# Patient Record
Sex: Male | Born: 1940 | Race: White | Hispanic: No | Marital: Married | Smoking: Never smoker
Health system: Southern US, Community
[De-identification: ages and names within clinical notes are randomized; demographics above are authoritative.]

## PROBLEM LIST (undated history)

## (undated) DIAGNOSIS — C801 Malignant (primary) neoplasm, unspecified: Secondary | ICD-10-CM

## (undated) DIAGNOSIS — K222 Esophageal obstruction: Secondary | ICD-10-CM

## (undated) DIAGNOSIS — I10 Essential (primary) hypertension: Secondary | ICD-10-CM

## (undated) DIAGNOSIS — T7840XA Allergy, unspecified, initial encounter: Secondary | ICD-10-CM

## (undated) DIAGNOSIS — E119 Type 2 diabetes mellitus without complications: Secondary | ICD-10-CM

## (undated) DIAGNOSIS — E785 Hyperlipidemia, unspecified: Secondary | ICD-10-CM

## (undated) DIAGNOSIS — K5730 Diverticulosis of large intestine without perforation or abscess without bleeding: Secondary | ICD-10-CM

## (undated) DIAGNOSIS — H269 Unspecified cataract: Secondary | ICD-10-CM

## (undated) DIAGNOSIS — K219 Gastro-esophageal reflux disease without esophagitis: Secondary | ICD-10-CM

## (undated) HISTORY — DX: Allergy, unspecified, initial encounter: T78.40XA

## (undated) HISTORY — DX: Hyperlipidemia, unspecified: E78.5

## (undated) HISTORY — DX: Type 2 diabetes mellitus without complications: E11.9

## (undated) HISTORY — DX: Essential (primary) hypertension: I10

## (undated) HISTORY — DX: Malignant (primary) neoplasm, unspecified: C80.1

## (undated) HISTORY — DX: Esophageal obstruction: K22.2

## (undated) HISTORY — DX: Unspecified cataract: H26.9

## (undated) HISTORY — DX: Gastro-esophageal reflux disease without esophagitis: K21.9

## (undated) HISTORY — DX: Diverticulosis of large intestine without perforation or abscess without bleeding: K57.30

---

## 1973-08-09 HISTORY — PX: INGUINAL HERNIA REPAIR: SUR1180

## 2005-08-09 HISTORY — PX: LAPAROSCOPIC CHOLECYSTECTOMY: SUR755

## 2005-08-26 ENCOUNTER — Ambulatory Visit: Payer: Self-pay | Admitting: Gastroenterology

## 2005-08-27 ENCOUNTER — Ambulatory Visit (HOSPITAL_COMMUNITY): Admission: RE | Admit: 2005-08-27 | Discharge: 2005-08-27 | Payer: Self-pay | Admitting: Gastroenterology

## 2005-09-17 ENCOUNTER — Ambulatory Visit: Payer: Self-pay | Admitting: Gastroenterology

## 2005-09-27 ENCOUNTER — Ambulatory Visit: Payer: Self-pay | Admitting: Gastroenterology

## 2005-09-27 DIAGNOSIS — K5730 Diverticulosis of large intestine without perforation or abscess without bleeding: Secondary | ICD-10-CM

## 2005-09-27 HISTORY — DX: Diverticulosis of large intestine without perforation or abscess without bleeding: K57.30

## 2005-10-22 ENCOUNTER — Ambulatory Visit (HOSPITAL_COMMUNITY): Admission: RE | Admit: 2005-10-22 | Discharge: 2005-10-22 | Payer: Self-pay | Admitting: Surgery

## 2005-10-22 ENCOUNTER — Encounter (INDEPENDENT_AMBULATORY_CARE_PROVIDER_SITE_OTHER): Payer: Self-pay | Admitting: *Deleted

## 2006-05-03 ENCOUNTER — Ambulatory Visit: Payer: Self-pay | Admitting: Gastroenterology

## 2008-06-03 ENCOUNTER — Ambulatory Visit: Payer: Self-pay | Admitting: Gastroenterology

## 2008-06-03 DIAGNOSIS — I10 Essential (primary) hypertension: Secondary | ICD-10-CM | POA: Insufficient documentation

## 2008-06-03 DIAGNOSIS — E119 Type 2 diabetes mellitus without complications: Secondary | ICD-10-CM | POA: Insufficient documentation

## 2008-06-03 DIAGNOSIS — K2990 Gastroduodenitis, unspecified, without bleeding: Secondary | ICD-10-CM

## 2008-06-03 DIAGNOSIS — K222 Esophageal obstruction: Secondary | ICD-10-CM | POA: Insufficient documentation

## 2008-06-03 DIAGNOSIS — K219 Gastro-esophageal reflux disease without esophagitis: Secondary | ICD-10-CM

## 2008-06-03 DIAGNOSIS — E7800 Pure hypercholesterolemia, unspecified: Secondary | ICD-10-CM

## 2008-06-03 DIAGNOSIS — K5730 Diverticulosis of large intestine without perforation or abscess without bleeding: Secondary | ICD-10-CM | POA: Insufficient documentation

## 2008-06-03 DIAGNOSIS — K2970 Gastritis, unspecified, without bleeding: Secondary | ICD-10-CM | POA: Insufficient documentation

## 2008-06-05 ENCOUNTER — Encounter: Payer: Self-pay | Admitting: Gastroenterology

## 2009-02-21 ENCOUNTER — Ambulatory Visit: Payer: Self-pay | Admitting: Gastroenterology

## 2009-02-21 DIAGNOSIS — R141 Gas pain: Secondary | ICD-10-CM

## 2009-02-21 DIAGNOSIS — R142 Eructation: Secondary | ICD-10-CM

## 2009-02-21 DIAGNOSIS — R143 Flatulence: Secondary | ICD-10-CM

## 2009-02-21 DIAGNOSIS — K902 Blind loop syndrome, not elsewhere classified: Secondary | ICD-10-CM

## 2009-02-21 LAB — CONVERTED CEMR LAB: Tissue Transglutaminase Ab, IgA: 0.5 units (ref <7)

## 2009-03-03 ENCOUNTER — Telehealth: Payer: Self-pay | Admitting: Gastroenterology

## 2009-06-10 ENCOUNTER — Encounter: Payer: Self-pay | Admitting: Gastroenterology

## 2009-08-20 ENCOUNTER — Telehealth: Payer: Self-pay | Admitting: Gastroenterology

## 2010-03-03 ENCOUNTER — Ambulatory Visit: Payer: Self-pay | Admitting: Gastroenterology

## 2010-03-23 ENCOUNTER — Ambulatory Visit (HOSPITAL_COMMUNITY): Admission: RE | Admit: 2010-03-23 | Discharge: 2010-03-23 | Payer: Self-pay | Admitting: Gastroenterology

## 2010-03-24 ENCOUNTER — Telehealth: Payer: Self-pay | Admitting: Gastroenterology

## 2010-03-27 ENCOUNTER — Telehealth: Payer: Self-pay | Admitting: Gastroenterology

## 2010-04-06 ENCOUNTER — Telehealth: Payer: Self-pay | Admitting: Gastroenterology

## 2010-04-15 ENCOUNTER — Encounter: Payer: Self-pay | Admitting: Gastroenterology

## 2010-04-16 ENCOUNTER — Encounter: Payer: Self-pay | Admitting: Gastroenterology

## 2010-08-30 ENCOUNTER — Encounter: Payer: Self-pay | Admitting: Gastroenterology

## 2010-09-08 NOTE — Progress Notes (Signed)
Summary: results request   Phone Note Call from Patient Call back at Home Phone [phone]   Caller: Patient Call For: Dr. Jarold Motto Reason for Call: Talk to Nurse Summary of Call: would like results from emptying scan Initial call taken by: Vallarie Mare,  March 24, 2010 2:57 PM  Follow-up for Phone Call        LM for pt to call.   Lupita Leash Surface RN  March 24, 2010 3:33 PM  Pt notified of results.  Pt asks if there is any thing else he can do to help with the belching.  Currently taking Aciphex. Follow-up by: Ashok Cordia RN,  March 25, 2010 8:57 AM  Additional Follow-up for Phone Call Additional follow up Details #1::        increase aciphex two times a day....schedule manometry and ph probe off  rx 72h... Additional Follow-up by: Mardella Layman MD Clementeen Graham,  March 25, 2010 9:08 AM    Additional Follow-up for Phone Call Additional follow up Details #2::    LM for pt to call.  Lupita Leash Surface RN  March 25, 2010 12:09 PM  Called phone number listed.  This is the wrong number per person answering phone.  Called work number listed.  Pt no longer works at this number.  He has retired per person answering phone.  Letter mailed asking pt to call. Follow-up by: Ashok Cordia RN,  March 26, 2010 8:51 AM   Appended Document: results request Pt notified.  Pt would like to try two times a day aciphex before he schedules any further tests.  Pt will reoprt back.  (phone numbers verified)   Clinical Lists Changes  Medications: Changed medication from ACIPHEX 20 MG  TBEC (RABEPRAZOLE SODIUM) Take 1 each day 30 minutes before meals to ACIPHEX 20 MG  TBEC (RABEPRAZOLE SODIUM) 1 tab by mouth two times a day - Signed Rx of ACIPHEX 20 MG  TBEC (RABEPRAZOLE SODIUM) 1 tab by mouth two times a day;  #60 x 6;  Signed;  Entered by: Ashok Cordia RN;  Authorized by: Mardella Layman MD [facility];  Method used: Electronically to [facility] [id number] [place],  [place], [place]  [address], Ph: [phone] or [phone], Fax: [phone]    Prescriptions: ACIPHEX 20 MG  TBEC (RABEPRAZOLE SODIUM) 1 tab by mouth two times a day  #60 x 6   Entered by:   Ashok Cordia RN   Authorized by:   Mardella Layman MD [facility]   Signed by:   Ashok Cordia RN on 03/30/2010   Method used:   Electronically to        [facility]  [address]       [address]       [place], [place]  [address]       Ph: [phone] or [phone]       Fax: [phone]   RxID:   [id number]

## 2010-09-08 NOTE — Progress Notes (Signed)
Summary: discount card   Phone Note Call from Patient Call back at Home Phone [phone]   Caller: Patient Call For: Jarold Motto Reason for Call: Talk to Nurse Summary of Call: Patient would like discount card for Aciphex mailed over to his house Initial call taken by: Tawni Levy,  August 20, 2009 10:13 AM  Follow-up for Phone Call        Discount card mailed to pt.  Wife notified.  Follow-up by: Ashok Cordia RN,  August 20, 2009 1:48 PM

## 2010-09-08 NOTE — Assessment & Plan Note (Signed)
Summary: f/u//belching--ch.   History of Present Illness Visit Type: Follow-up Visit Primary GI MD: Sheryn Bison MD FACP FAGA Primary Provider: Roney Marion, MD Requesting Provider: n/a Chief Complaint: follow up GERD, pt still having some belching on AcipHex History of Present Illness:   postprandial burping and belching and mild early satiety and a 70 year old white male with insulin-dependent diabetes. He has had previous endoscopy which confirmed gastroesophageal reflux disease. Is on AcipHex 20 mg a day, insulin, metformin, and Actos. He denies hepatobiliary or lower gastrointestinal problems. He is status post cholecystectomy.   GI Review of Systems    Reports belching.      Denies abdominal pain, acid reflux, bloating, chest pain, dysphagia with liquids, dysphagia with solids, heartburn, loss of appetite, nausea, vomiting, vomiting blood, weight loss, and  weight gain.        Denies anal fissure, black tarry stools, change in bowel habit, constipation, diarrhea, diverticulosis, fecal incontinence, heme positive stool, hemorrhoids, irritable bowel syndrome, jaundice, light color stool, liver problems, rectal bleeding, and  rectal pain.    Current Medications (verified): 1)  Lantus 100 Unit/ml Soln (Insulin Glargine) .... Inject 45 Units At Bedtime 2)  Vytorin 10-20 Mg Tabs (Ezetimibe-Simvastatin) .... Take One By Mouth Once Daily 3)  Actos 45 Mg Tabs (Pioglitazone Hcl) .Marland Kitchen.. 1 Tablet By Mouth Once Daily 4)  Metformin Hcl 1000 Mg Tabs (Metformin Hcl) .... Take One By Mouth Two Times A Day 5)  Humalog Kwikpen 100 Unit/ml Soln (Insulin Lispro (Human)) .... Take As Directed 6)  Aciphex 20 Mg  Tbec (Rabeprazole Sodium) .... Take 1 Each Day 30 Minutes Before Meals 7)  Cozaar 100 Mg Tabs (Losartan Potassium) .... One Tablet By Mouth Once Daily 8)  Maalox 600 Mg Chew (Calcium Carbonate Antacid) .... As Needed For Heartburn  Allergies (verified): No Known Drug Allergies  Past  History:  Past medical, surgical, family and social histories (including risk factors) reviewed for relevance to current acute and chronic problems.  Past Medical History: Reviewed history from 02/21/2009 and no changes required. Current Problems:  ABDOMINAL BLOATING (ICD-787.3) HYPERCHOLESTEROLEMIA (ICD-272.0) HYPERTENSION (ICD-401.9) DIABETES MELLITUS, TYPE II, ON INSULIN (ICD-250.00) GERD (ICD-530.81) DIVERTICULOSIS, COLON (ICD-562.10) ESOPHAGEAL STRICTURE (ICD-530.3) GASTRITIS (ICD-535.50)  Past Surgical History: Reviewed history from 06/03/2008 and no changes required. Cholecystectomy  Family History: Reviewed history from 06/03/2008 and no changes required. No FH of Colon Cancer: Family History of Diabetes: grandparents  Social History: Reviewed history from 02/21/2009 and no changes required. Married, 2 girls Patient has never smoked.  Alcohol Use - no Illicit Drug Use - no Occupation: Retired [employer] Daily Caffeine Use: 3 cups of coffee daily and 2 sodas daily  Patient does not get regular exercise.   Review of Systems  The patient denies allergy/sinus, anemia, anxiety-new, arthritis/joint pain, back pain, blood in urine, breast changes/lumps, confusion, cough, coughing up blood, depression-new, fainting, fatigue, fever, headaches-new, hearing problems, heart murmur, heart rhythm changes, itching, muscle pains/cramps, night sweats, nosebleeds, shortness of breath, skin rash, sleeping problems, sore throat, swelling of feet/legs, swollen lymph glands, thirst - excessive, urination - excessive, urination changes/pain, urine leakage, vision changes, and voice change.    Vital Signs:  Patient profile:   70 year old male Height:      68 inches Weight:      195 pounds BMI:     29.76 Pulse rate:   68 / minute Pulse rhythm:   regular BP sitting:   122 / 66  (left arm) Cuff size:  regular  Vitals Entered By: Francee Piccolo CMA Duncan Dull) (March 03, 2010 1:42 PM)  Physical Exam  General:  Well developed, well nourished, no acute distress.healthy appearing.   Head:  Normocephalic and atraumatic. Eyes:  PERRLA, no icterus.exam deferred to patient's ophthalmologist.   Lungs:  Clear throughout to auscultation. Heart:  Regular rate and rhythm; no murmurs, rubs,  or bruits. Abdomen:  Soft, nontender and nondistended. No masses, hepatosplenomegaly or hernias noted. Normal bowel sounds.I cannot appreciate a succussion splash. Extremities:  No clubbing, cyanosis, edema or deformities noted. Neurologic:  Alert and  oriented x4;  grossly normal neurologically. Psych:  Alert and cooperative. Normal mood and affect.   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 We will check gastric emptying scan and initiate domperidone therapy if needed. He is to continue his antireflux maneuvers and daily PPI therapy pending these results. 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  Patient Instructions: 1)  You are scheduled for a gastric emptying scan. 2)  Please continue current medications.  3)  Liquids and foods should be eaten in small, frequent meals. Refer to brochure for further instruction.  4)  The medication list was reviewed and reconciled.  All changed / newly prescribed medications were explained.  A complete medication list was provided to the patient / caregiver. 5)  Copy sent to : Dr. Roney Marion. 6)  Please continue current medications.  Prescriptions: ACIPHEX 20 MG  TBEC (RABEPRAZOLE SODIUM) Take 1 each day 30 minutes before meals  #30 x 6   Entered by:   Ashok Cordia RN   Authorized by:   Mardella Layman MD [facility]   Signed by:   Ashok Cordia RN on 03/03/2010   Method used:   Print then Give to Patient    RxID:   [id number]

## 2010-09-08 NOTE — Medication Information (Signed)
Summary: Prior Aut[employer] & Approved for Aciphex/[employer]  Prior [employer] Approved for Aciphex/[employer]   Imported By: Sherian Rein 04/21/2010 09:36:11  _____________________________________________________________________  External Attachment:    Type:   Image     Comment:   External Document

## 2010-09-08 NOTE — Miscellaneous (Signed)
Summary: Walkin  Patient walked in to check on his prior auth and advised him i called [employer] and advised pt that I will call him when we hear back from [facility]. I gave him samples again.   Clinical Lists Changes  Medications: Changed medication from ACIPHEX 20 MG  TBEC (RABEPRAZOLE SODIUM) 1 tab by mouth two times a day to ACIPHEX 20 MG  TBEC (RABEPRAZOLE SODIUM) 1 tab by mouth two times a day  Appended Document: Walkin approved per fax valid from 03/26/2010-04/16/2011

## 2010-09-08 NOTE — Progress Notes (Signed)
Summary: Aciphex Prio Auth   Phone Note Outgoing Call   Call placed by: Ok Anis CMA,  March 27, 2010 9:24 AM Call placed to: Insurer Summary of Call: Prior Authorization done for Aciphex 20mg . Per Darl Pikes at [place] [phone] there is already a Prior Authorization that expires 06-10-2010 so patients Aciphex is covered until then. Darl Pikes said that she sent the information to patients pharmacy so they can refill the RX

## 2010-09-08 NOTE — Progress Notes (Signed)
Summary: speak to nurse re: Aciphex  Medications Added ACIPHEX 20 MG  TBEC (RABEPRAZOLE SODIUM) 1 tab by mouth two times a day       Phone Note Call from Patient Call back at [facility] Phone [phone]   Caller: Patient Call For: Dr Jarold Motto Reason for Call: Talk to Nurse Summary of Call: Patient wants nurse to call pharmacy regarding taking aciphex twice a day. Patient thinks that it was auth for once a day not twice a day. Initial call taken by: Tawni Levy,  April 06, 2010 4:18 PM  Follow-up for Phone Call        Pt states that pharmacist is telling him that Aciphex is not going through on insurance.  It may because it is prescribed two times a day and authorixation is for one per day.  We will try to get samples of aciphex for pt to last until we can get approval. Follow-up by: Ashok Cordia RN,  April 07, 2010 8:39 AM  Additional Follow-up for Phone Call Additional follow up Details #1::        Samples given.  Pt notified. Additional Follow-up by: Ashok Cordia RN,  April 07, 2010 1:51 PM    New/Updated Medications: ACIPHEX 20 MG  TBEC (RABEPRAZOLE SODIUM) 1 tab by mouth two times a day

## 2010-11-25 ENCOUNTER — Other Ambulatory Visit: Payer: Self-pay | Admitting: Gastroenterology

## 2010-12-25 NOTE — Op Note (Signed)
NAMELAURICE, KIMMONS                  ACCOUNT NO.:  [id number]   MEDICAL RECORD NO.:  [id number]          PATIENT TYPE:  AMB   LOCATION:  DAY                          FACILITY[facility]  [facility]   PHYSICIAN:  Currie Paris, M.D.DATE OF BIRTH:  Dec 13, 1940   DATE OF PROCEDURE:  10/22/2005  DATE OF DISCHARGE:                                 OPERATIVE REPORT   CCS:  [phone].   PREOPERATIVE DIAGNOSIS:  Chronic calculus cholecystitis.   POSTOPERATIVE DIAGNOSIS:  Chronic calculus cholecystitis.   OPERATION:  Laparoscopic cholecystectomy with operative cholangiogram.   SURGEON:  Dr. Jamey Ripa.   ASSISTANT:  Dr. Orson Slick.   ANESTHESIA:  General.   CLINICAL HISTORY:  This is a 70 year old male with insulin dependent  diabetes and gallstones who was having symptoms suggestive of biliary tract  disease. We elected to proceed to cholecystectomy.   DESCRIPTION OF PROCEDURE:  The patient was seen in the holding area and he  had no further questions. He was taken to the operating room and after  satisfactory general endotracheal anesthesia had been obtained, the abdomen  was clipped, prepped and draped. The time-out occurred.   0.25% plain Marcaine was used for each incision. The umbilical incision was  made, the fascia opened and the peritoneal cavity entered under direct  vision. The pursestring was placed, the Hasson introduced and the abdomen  insufflated to 15.   A camera was placed and there were no gross abnormalities noted. The patient  was placed in reverse Trendelenburg and tilted to the left. Under direct  vision, a 10/11 trocar was placed in the epigastrium and two 5 mm trocars  were placed laterally.   The gallbladder was a little bit contracted but otherwise unremarkable. The  liver appeared normal.   The gallbladder was retracted over the liver. The cystic duct was opened and  the triangle of Calot dissected. I clearly identified a lengthy segment of  cystic duct as well as the  cystic artery. I opened up the peritoneum on  either side of the gallbladder to be sure we had a nice window and that the  anatomy was confirmed.   At this point, a clip was placed in the cystic duct at its junction with  the gallbladder and one on the cystic artery. The cystic duct was opened and  a Cook catheter introduced percutaneously and threaded into the cystic duct  and held in place with a clip.   The operative cholangiogram was then done. This appeared normal with good  filling of the common duct, hepatic radicals and duodenum with no filling  defects noted.   The cystic duct catheter was removed and two clips placed on the stay side  of the cystic duct. Two additional clips were placed in the cystic artery.  Both were divided leaving two clips on the stay side.   The gallbladder was removed from below to above with coagulation current of  the cautery. Just prior to disconnecting it, we irrigated and cauterized the  bed and made sure everything was dry. The gallbladder was then disconnected  and brought  out the umbilical port.   The abdomen was reinsufflated and the final check made for hemostasis and  again everything appeared okay. The lateral ports were removed and there was  no bleeding. The abdomen was deflated through the epigastric port. The  pursestring was used to close the umbilicus and a Vicryl was used to close  the fascia at the epigastric site. The skin was closed with 4-0 Monocryl  subcuticular and Dermabond.   The patient tolerated the procedure well. There were no operative  complications. All counts were correct.      Currie Paris, M.D.  Electronically Signed     CJS/MEDQ  D:  10/22/2005  T:  10/23/2005  Job:  [id number]   cc:   Gayland Curry, MD   Vania Rea. Jarold Motto, M.D. [facility]  [address]  [place]  [place] [address]

## 2010-12-25 NOTE — Assessment & Plan Note (Signed)
[facility]                           GASTROENTEROLOGY OFFICE NOTE   NAME:Exton, KALYN DIMATTIA                         MRN:          [id number]  DATE:05/03/2006                            DOB:          09-12-1940    PROBLEM LIST:  1. Gastroesophageal reflux disease with history of esophageal stricture.  2. Status post laparoscopic cholecystectomy in March of 2007.  3. Insulin dependent diabetes mellitus.   HISTORY:  Brailon comes in today for follow-up.  He has been on Nexium 40 mg  per day over the past several months after undergoing endoscopy in February  of 2007 which did show evidence of chronic gastroesophageal reflux disease  and a stricture in the distal esophagus which was Elease Hashimoto dilated to #58.  He says that even with the Nexium, he continued to have a lot of belching  and burping and some indigestion during the day time hours.  He has not had  any night time symptoms, has no current complaints of dysphagia or  odynophagia.  Denies any early satiety symptoms or significant bloating.  He  is not aware of any sour brash, has not been regurgitating.  He says he ran  out of his Nexium a couple of weeks ago and actually tried some of his  wife's Aciphex which he says seems to work much better and he would like a  prescription for Aciphex.  He also says that he feels that the Nexium was  causing him to have some diarrhea.   CURRENT MEDICATIONS:  1. Lantus 30 units at bedtime.  2. Humalog p.r.n.  3. Altace 10 mg daily.  4. Benicar 40 daily.  5. Vytorin 10/20 daily.  6. Metformin 1000 mg twice daily.  7. Actos 20 daily.   ALLERGIES:  NO KNOWN DRUG ALLERGIES.   EXAM:  Discussion only today.  Weight is 183, blood pressure 126/74, pulse  60, not further examined.   IMPRESSION:  1. Chronic gastroesophageal reflux disease with history of distal      esophageal stricture, currently asymptomatic.  Gastroesophageal reflux      disease symptoms poorly  controlled with Nexium.  Question component of      diabetic visceral neuropathy.   PLAN:  1. I will switch him to Aciphex 20 mg by mouth daily every morning, a.c.      breakfast.  He was      given samples and a prescription today.  He is asked to follow-up in      four to six weeks if the Aciphex does not well control his symptoms.                                   Amy Esterwood, PA-C                                Vania Rea. Jarold Motto, MD, Clementeen Graham, FACP   AE/MedQ  DD:  05/03/2006  DT:  05/05/2006  Job #:  [id number] 

## 2011-05-04 ENCOUNTER — Telehealth: Payer: Self-pay | Admitting: Gastroenterology

## 2011-05-04 NOTE — Telephone Encounter (Signed)
I have already done prior auth I am waiting on his insurance to let me know if its approved.Marland Kitchen

## 2011-05-07 ENCOUNTER — Telehealth: Payer: Self-pay | Admitting: Gastroenterology

## 2011-05-07 NOTE — Telephone Encounter (Signed)
Done prior auth on the phone with [employer] and drug and qty is approved. pts wife aware.

## 2011-05-07 NOTE — Telephone Encounter (Signed)
I will call about the prior auth today.

## 2011-07-06 ENCOUNTER — Other Ambulatory Visit: Payer: Self-pay | Admitting: Gastroenterology

## 2011-09-09 ENCOUNTER — Telehealth: Payer: Self-pay | Admitting: Gastroenterology

## 2011-09-09 MED ORDER — LANSOPRAZOLE 30 MG PO CPDR: 30.0000 mg | DELAYED_RELEASE_CAPSULE | Freq: Every day | ORAL | Status: DC

## 2011-09-09 NOTE — Telephone Encounter (Signed)
Per pt his insurance will not cover aciphex any longer but will cover prevacid he would like to have it sent to his local pharmacy to try for 30 days, I have sent it for once a day and if that doesn't work well will send it for twice a day dosing. He will call back in one month after trying it.

## 2011-11-02 ENCOUNTER — Telehealth: Payer: Self-pay | Admitting: Gastroenterology

## 2011-11-02 MED ORDER — LANSOPRAZOLE 30 MG PO CPDR: 30.0000 mg | DELAYED_RELEASE_CAPSULE | Freq: Two times a day (BID) | ORAL | Status: DC

## 2011-11-02 NOTE — Telephone Encounter (Deleted)
Patient states that the change in his PPI is working  But he feels he needs BID dosing of the Prevacid, his last office visit was 03/03/2010. Is it ok to just increase to BID dosing or does patient need an office visit as well?

## 2011-11-02 NOTE — Telephone Encounter (Signed)
rx changed to BID dosing and pt will need ov in July. Pt advised

## 2012-01-10 ENCOUNTER — Other Ambulatory Visit: Payer: Self-pay | Admitting: Gastroenterology

## 2012-02-02 ENCOUNTER — Encounter: Payer: Self-pay | Admitting: *Deleted

## 2012-02-15 ENCOUNTER — Other Ambulatory Visit (INDEPENDENT_AMBULATORY_CARE_PROVIDER_SITE_OTHER): Payer: Medicare Other

## 2012-02-15 ENCOUNTER — Encounter: Payer: Self-pay | Admitting: Gastroenterology

## 2012-02-15 ENCOUNTER — Ambulatory Visit (INDEPENDENT_AMBULATORY_CARE_PROVIDER_SITE_OTHER): Payer: Medicare Other | Admitting: Gastroenterology

## 2012-02-15 VITALS — BP 124/64 | HR 60 | Ht 68.0 in | Wt 194.0 lb

## 2012-02-15 DIAGNOSIS — K219 Gastro-esophageal reflux disease without esophagitis: Secondary | ICD-10-CM

## 2012-02-15 DIAGNOSIS — Z8639 Personal history of other endocrine, nutritional and metabolic disease: Secondary | ICD-10-CM

## 2012-02-15 DIAGNOSIS — R6889 Other general symptoms and signs: Secondary | ICD-10-CM

## 2012-02-15 DIAGNOSIS — R52 Pain, unspecified: Secondary | ICD-10-CM

## 2012-02-15 DIAGNOSIS — E119 Type 2 diabetes mellitus without complications: Secondary | ICD-10-CM

## 2012-02-15 DIAGNOSIS — Z862 Personal history of diseases of the blood and blood-forming organs and certain disorders involving the immune mechanism: Secondary | ICD-10-CM

## 2012-02-15 LAB — IBC PANEL
Iron: 83 ug/dL (ref 42–165)
Transferrin: 256.6 mg/dL (ref 212.0–360.0)

## 2012-02-15 LAB — FOLATE: Folate: 17.2 ng/mL (ref >5.9)

## 2012-02-15 LAB — VITAMIN B12: Vitamin B-12: 345 pg/mL (ref 211–911)

## 2012-02-15 MED ORDER — ALIGN 4 MG PO CAPS: 1.0000 | ORAL_CAPSULE | Freq: Every day | ORAL | Status: DC

## 2012-02-15 MED ORDER — LANSOPRAZOLE 30 MG PO CPDR: DELAYED_RELEASE_CAPSULE | ORAL | Status: DC

## 2012-02-15 NOTE — Progress Notes (Signed)
This is a 71 year old Caucasian male with insulin-dependent diabetes, follow for several years with chronic GERD. He currently is on Prevacid 30 mg twice a day and is asymptomatic without acid reflux symptoms or dysphagia, lower gastrointestinal or hepatobiliary complaints. His appetite is good and his weight is stable. He does take a daily stool softener. The patient is up-to-date on his endoscopy and colonoscopy. He has yearly blood work performed last primary care physicians.   Current Medications, Allergies, Past Medical History, Past Surgical History, Family History and Social History were reviewed in [employer] record.  Pertinent Review of Systems Negative   Physical Exam: Healthy patient in no distress. Blood pressure 124 was 64, pulse 60 and regular, and weight under 94 pounds with a BMI of 29.50. I cannot appreciate stigmata of chronic liver disease. Chest is clear and he is in a regular rhythm without murmurs gallops or rubs. There is no organomegaly, abdominal masses or tenderness. Bowel sounds are normal. Peripheral extremities are unremarkable and mental status is normal.    Assessment and Plan: Chronic GERD doing well on daily PPI therapy. I will check his serum B12 level, have renewed his prescriptions, and we'll see him on a yearly basis unless otherwise needed. He is up-to-date on his endoscopic exams, and his diabetes is followed closely by his primary care physician. Standard antireflux regime reviewed with patient. Encounter Diagnoses  Name Primary?  . Esophageal reflux Yes  . General symptoms(aka GENERAL SYMPTOMS)    . A vague feeling of discomfort    . Other general symptoms    . Diabetes mellitus   . H/O diabetes mellitus

## 2012-02-15 NOTE — Patient Instructions (Addendum)
Your physician has requested that you go to the basement for the following lab work before leaving today:  Your prescription for Prevacid has been sent to your mail order pharmacy. We have given you samples of Align. This puts good bacteria back into your colon. You should take 1 capsule by mouth once daily. If this works well for you, it can be purchased over the counter.

## 2012-02-17 LAB — FERRITIN: Ferritin: 53.3 ng/mL (ref 22.0–322.0)

## 2012-02-22 ENCOUNTER — Telehealth: Payer: Self-pay | Admitting: Gastroenterology

## 2012-02-22 NOTE — Telephone Encounter (Signed)
Dr Jarold Motto, can you review labs from 02/15/12 and advise if anything? Thanks.

## 2012-02-22 NOTE — Telephone Encounter (Signed)
Pt aware.

## 2012-02-22 NOTE — Telephone Encounter (Signed)
lmom for pt to call back

## 2012-02-22 NOTE — Telephone Encounter (Signed)
All normal.

## 2012-09-14 ENCOUNTER — Other Ambulatory Visit: Payer: Self-pay | Admitting: *Deleted

## 2012-09-14 MED ORDER — LANSOPRAZOLE 30 MG PO CPDR: DELAYED_RELEASE_CAPSULE | ORAL | Status: DC

## 2012-10-18 ENCOUNTER — Telehealth: Payer: Self-pay | Admitting: Gastroenterology

## 2012-10-18 MED ORDER — LANSOPRAZOLE 30 MG PO CPDR: DELAYED_RELEASE_CAPSULE | ORAL | Status: DC

## 2012-10-18 NOTE — Telephone Encounter (Signed)
Rx sent 

## 2012-11-06 ENCOUNTER — Telehealth: Payer: Self-pay | Admitting: *Deleted

## 2012-11-06 NOTE — Telephone Encounter (Signed)
Received via fax prior auth for patients Lansoprazole.  I was given four numbers to call for prior auth with no success when called. [phone]  Called pharmacy back([facility]) and they could not offer any other numbers to call expect one that was attempted earlier.   I have forms for request for Medicare Prescription Drug Coverage Determination. Form was filled out and faxed.  Pharmacy notified that I am waiting on a response back from faxed information.  Left message on patients voicemail for return call back.

## 2012-11-07 ENCOUNTER — Telehealth: Payer: Self-pay | Admitting: Gastroenterology

## 2012-11-07 NOTE — Telephone Encounter (Signed)
I called Alissa at [facility] back and gave information on last EGD.  Per Alissa we should know by end day or tomorrow about patient's prior authorization    Left message for patient to call office back. Needed to let patient to know about prior authorization status and if they have enough medication to get through.

## 2012-11-08 ENCOUNTER — Telehealth: Payer: Self-pay | Admitting: *Deleted

## 2012-11-08 MED ORDER — ESOMEPRAZOLE MAGNESIUM 40 MG PO CPDR: 40.0000 mg | DELAYED_RELEASE_CAPSULE | Freq: Every day | ORAL | Status: DC

## 2012-11-08 NOTE — Telephone Encounter (Signed)
Patient called back and I advised patient that Lansoprazole was not covered by [facility].   Advised patient I can send Nexium and he can try it at one daily and if that does not work we can go for two a day.  Patient verbalized understanding.

## 2012-11-08 NOTE — Telephone Encounter (Signed)
Lansoprazole was denied by [facility].  Called patient to inform him of this and to see what else he has tried.  Left message for patient to call office back.  I can try sending Nexium, Omeprazole or Protonix.

## 2012-12-05 ENCOUNTER — Telehealth: Payer: Self-pay | Admitting: Gastroenterology

## 2012-12-05 MED ORDER — OMEPRAZOLE 40 MG PO CPDR: 40.0000 mg | DELAYED_RELEASE_CAPSULE | Freq: Every day | ORAL | Status: DC

## 2012-12-05 NOTE — Telephone Encounter (Signed)
Sent Omeprazole.

## 2013-12-03 ENCOUNTER — Other Ambulatory Visit: Payer: Self-pay | Admitting: Gastroenterology

## 2013-12-04 ENCOUNTER — Ambulatory Visit (INDEPENDENT_AMBULATORY_CARE_PROVIDER_SITE_OTHER): Payer: Medicare PPO | Admitting: Gastroenterology

## 2013-12-04 ENCOUNTER — Encounter: Payer: Self-pay | Admitting: Gastroenterology

## 2013-12-04 VITALS — BP 128/70 | HR 64 | Ht 67.0 in | Wt 205.2 lb

## 2013-12-04 DIAGNOSIS — K219 Gastro-esophageal reflux disease without esophagitis: Secondary | ICD-10-CM

## 2013-12-04 MED ORDER — OMEPRAZOLE 40 MG PO CPDR: 40.0000 mg | DELAYED_RELEASE_CAPSULE | Freq: Every day | ORAL | Status: DC

## 2013-12-04 NOTE — Progress Notes (Addendum)
12/04/2013 Cody Mccann [id number] 1940-09-16   History of Present Illness:  This is a pleasant 73 year old male who was previously known to Dr. Sharlett Iles.  He follows here for chronic GERD and is usually just seen once a year to get his medication refilled.  His symptoms are well controlled on omeprazole 40 mg daily.  No complaints.  Had colonoscopy in 09/2005 with only diverticulosis seen.  Will be due for recall in 09/2015.  Would like to have Dr. Hilarie Fredrickson as his new GI MD.   Current Medications, Allergies, Past Medical History, Past Surgical History, Family History and Social History were reviewed in [place] record.   Physical Exam: BP 128/70  Pulse 64  Ht 5\' 7"  (1.702 m)  Wt 205 lb 4 oz (93.101 kg)  BMI 32.14 kg/m2 General: Well developed white male in no acute distress Head: Normocephalic and atraumatic Eyes:  Sclerae anicteric, conjunctiva pink  Ears: Normal auditory acuity Lungs: Clear throughout to auscultation Heart: Regular rate and rhythm Abdomen: Soft, non-distended.  Normal bowel sounds.  Non-tender. Musculoskeletal: Symmetrical with no gross deformities  Extremities: No edema  Neurological: Alert oriented x 4, grossly non-focal Psychological:  Alert and cooperative. Normal mood and affect  Assessment and Recommendations: -GERD:  Well-controlled on omeprazole 40 mg daily.  Will continue and we will refill that for him.  He would like Dr. Hilarie Fredrickson to be his new GI MD.  Addendum: Reviewed and agree with management. Jerene Bears, MD

## 2013-12-04 NOTE — Patient Instructions (Signed)
We have sent the following medications to your pharmacy for you to pick up at your convenience: Omeprazole 40 mg  You will be due for a recall colonoscopy in February 2017. We will send you a reminder in the mail when it gets closer to that time.

## 2014-11-25 DIAGNOSIS — Z961 Presence of intraocular lens: Secondary | ICD-10-CM | POA: Diagnosis not present

## 2014-11-25 DIAGNOSIS — E119 Type 2 diabetes mellitus without complications: Secondary | ICD-10-CM | POA: Diagnosis not present

## 2014-11-25 DIAGNOSIS — H43813 Vitreous degeneration, bilateral: Secondary | ICD-10-CM | POA: Diagnosis not present

## 2014-11-25 DIAGNOSIS — Z9841 Cataract extraction status, right eye: Secondary | ICD-10-CM | POA: Diagnosis not present

## 2014-11-25 DIAGNOSIS — Z9842 Cataract extraction status, left eye: Secondary | ICD-10-CM | POA: Diagnosis not present

## 2014-11-25 DIAGNOSIS — H52222 Regular astigmatism, left eye: Secondary | ICD-10-CM | POA: Diagnosis not present

## 2014-11-25 DIAGNOSIS — H43313 Vitreous membranes and strands, bilateral: Secondary | ICD-10-CM | POA: Diagnosis not present

## 2014-11-25 DIAGNOSIS — I10 Essential (primary) hypertension: Secondary | ICD-10-CM | POA: Diagnosis not present

## 2014-11-25 DIAGNOSIS — H5202 Hypermetropia, left eye: Secondary | ICD-10-CM | POA: Diagnosis not present

## 2015-03-06 DIAGNOSIS — E780 Pure hypercholesterolemia: Secondary | ICD-10-CM | POA: Diagnosis not present

## 2015-03-06 DIAGNOSIS — Z0000 Encounter for general adult medical examination without abnormal findings: Secondary | ICD-10-CM | POA: Diagnosis not present

## 2015-03-06 DIAGNOSIS — I10 Essential (primary) hypertension: Secondary | ICD-10-CM | POA: Diagnosis not present

## 2015-03-06 DIAGNOSIS — E1169 Type 2 diabetes mellitus with other specified complication: Secondary | ICD-10-CM | POA: Diagnosis not present

## 2015-03-06 DIAGNOSIS — Z79899 Other long term (current) drug therapy: Secondary | ICD-10-CM | POA: Diagnosis not present

## 2015-05-06 DIAGNOSIS — E785 Hyperlipidemia, unspecified: Secondary | ICD-10-CM | POA: Diagnosis not present

## 2015-05-06 DIAGNOSIS — Z6831 Body mass index (BMI) 31.0-31.9, adult: Secondary | ICD-10-CM | POA: Diagnosis not present

## 2015-05-06 DIAGNOSIS — E1165 Type 2 diabetes mellitus with hyperglycemia: Secondary | ICD-10-CM | POA: Diagnosis not present

## 2015-05-06 DIAGNOSIS — E663 Overweight: Secondary | ICD-10-CM | POA: Diagnosis not present

## 2015-05-06 DIAGNOSIS — Z794 Long term (current) use of insulin: Secondary | ICD-10-CM | POA: Diagnosis not present

## 2015-05-06 DIAGNOSIS — I10 Essential (primary) hypertension: Secondary | ICD-10-CM | POA: Diagnosis not present

## 2015-09-02 ENCOUNTER — Encounter: Payer: Self-pay | Admitting: Internal Medicine

## 2015-10-02 ENCOUNTER — Ambulatory Visit (AMBULATORY_SURGERY_CENTER): Payer: Self-pay | Admitting: *Deleted

## 2015-10-02 VITALS — Ht 68.0 in | Wt 206.2 lb

## 2015-10-02 DIAGNOSIS — Z1211 Encounter for screening for malignant neoplasm of colon: Secondary | ICD-10-CM

## 2015-10-02 MED ORDER — NA SULFATE-K SULFATE-MG SULF 17.5-3.13-1.6 GM/177ML PO SOLN: ORAL | Status: DC

## 2015-10-02 NOTE — Progress Notes (Signed)
No allergies to eggs or soy. No problems with anesthesia.  Pt given Emmi instructions for colonoscopy  No oxygen use  No diet drug use  

## 2015-10-16 ENCOUNTER — Encounter: Payer: Self-pay | Admitting: Internal Medicine

## 2015-10-16 ENCOUNTER — Ambulatory Visit (AMBULATORY_SURGERY_CENTER): Payer: Medicare Other | Admitting: Internal Medicine

## 2015-10-16 VITALS — BP 107/59 | HR 52 | Temp 99.1°F | Resp 17 | Ht 68.0 in | Wt 206.0 lb

## 2015-10-16 DIAGNOSIS — Z1211 Encounter for screening for malignant neoplasm of colon: Secondary | ICD-10-CM | POA: Diagnosis not present

## 2015-10-16 DIAGNOSIS — D122 Benign neoplasm of ascending colon: Secondary | ICD-10-CM | POA: Diagnosis not present

## 2015-10-16 DIAGNOSIS — D123 Benign neoplasm of transverse colon: Secondary | ICD-10-CM | POA: Diagnosis not present

## 2015-10-16 DIAGNOSIS — D120 Benign neoplasm of cecum: Secondary | ICD-10-CM | POA: Diagnosis not present

## 2015-10-16 MED ORDER — SODIUM CHLORIDE 0.9 % IV SOLN: 500.0000 mL | INTRAVENOUS | Status: DC

## 2015-10-16 NOTE — Progress Notes (Signed)
A/ox3, pleased with MAC, report to RN 

## 2015-10-16 NOTE — Progress Notes (Signed)
Called to room to assist during endoscopic procedure.  Patient ID and intended procedure confirmed with present staff. Received instructions for my participation in the procedure from the performing physician.  

## 2015-10-16 NOTE — Op Note (Signed)
[place] Patient Name: Cody Mccann Procedure Date: 10/16/2015 7:27 AM MRN: [id number] Endoscopist: Jerene Bears , MD Age: 75 Referring MD:  Date of Birth: 1941/05/02 Gender: Male Procedure:                Colonoscopy Indications:              Screening for colorectal malignant neoplasm, Last                            colonoscopy 10 years ago Medicines:                Monitored Anesthesia Care Procedure:                Pre-Anesthesia Assessment:                           - Prior to the procedure, a History and Physical                            was performed, and patient medications and                            allergies were reviewed. The patient's tolerance of                            previous anesthesia was also reviewed. The risks                            and benefits of the procedure and the sedation                            options and risks were discussed with the patient.                            All questions were answered, and informed consent                            was obtained. Prior Anticoagulants: The patient has                            taken no previous anticoagulant or antiplatelet                            agents. ASA Grade Assessment: III - A patient with                            severe systemic disease. After reviewing the risks                            and benefits, the patient was deemed in                            satisfactory condition to undergo the procedure.  After obtaining informed consent, the colonoscope                            was passed under direct vision. Throughout the                            procedure, the patient's blood pressure, pulse, and                            oxygen saturations were monitored continuously. The                            Model CF-HQ190L [id number]) scope was introduced                            through the anus and advanced to the the cecum,                 identified by appendiceal orifice and ileocecal                            valve. The colonoscopy was performed without                            difficulty. The patient tolerated the procedure                            well. The quality of the bowel preparation was                            good. The ileocecal valve, appendiceal orifice, and                            rectum were photographed. Scope In: 8:08:34 AM Scope Out: 8:32:02 AM Scope Withdrawal Time: 0 hours 18 minutes 31 seconds  Total Procedure Duration: 0 hours 23 minutes 28 seconds  Findings:      A 20 mm polyp was found in the proximal transverse colon. The polyp was       sessile. The polyp was removed with a hot snare. Resection and retrieval       were complete.      Five sessile polyps were found in the transverse colon, ascending colon       and cecum. The polyps were 3 to 6 mm in size. These polyps were removed       with a cold snare. Resection and retrieval were complete.      Multiple diverticula were found in the left colon.      Internal hemorrhoids were found during retroflexion. The hemorrhoids       were small. Complications:            No immediate complications. Estimated Blood Loss:     Estimated blood loss was minimal. Impression:               - One 20 mm polyp in the proximal transverse colon,  removed with a hot snare. Resected and retrieved.                           - Five 3 to 6 mm polyps in the transverse colon, in                            the ascending colon and in the cecum, removed with                            a cold snare. Resected and retrieved.                           - Diverticulosis in the left colon.                           - Internal hemorrhoids. Recommendation:           - Patient has a contact number available for                            emergencies. The signs and symptoms of potential                            delayed  complications were discussed with the                            patient. Return to normal activities tomorrow.                            Written discharge instructions were provided to the                            patient.                           - Resume previous diet.                           - Continue present medications.                           - Await pathology results.                           - Repeat colonoscopy is recommended for                            surveillance. The colonoscopy date will be                            determined after pathology results from today's                            exam become available for review.                           -  No ibuprofen, naproxen, or other non-steroidal                            anti-inflammatory drugs for 2 weeks after polyp                            removal. Procedure Code(s):        --- Professional ---                           [phone], Colonoscopy, flexible; with removal of                            tumor(s), polyp(s), or other lesion(s) by snare                            technique CPT copyright 2016 [employer]. All rights reserved. Lajuan [employer]. Hilarie Fredrickson, MD Jerene Bears, MD 10/16/2015 8:39:05 AM This report has been signed electronically. Number of Addenda: 0

## 2015-10-16 NOTE — Patient Instructions (Signed)
Avoid NSAIDS (Advil, Aleve, Naprosyn, Motrin etc for two weeks. October 30, 2015.   YOU HAD AN ENDOSCOPIC PROCEDURE TODAY AT [facility]:   Refer to the procedure report that was given to you for any specific questions about what was found during the examination.  If the procedure report does not answer your questions, please call your gastroenterologist to clarify.  If you requested that your care partner not be given the details of your procedure findings, then the procedure report has been included in a sealed envelope for you to review at your convenience later.  YOU SHOULD EXPECT: Some feelings of bloating in the abdomen. Passage of more gas than usual.  Walking can help get rid of the air that was put into your GI tract during the procedure and reduce the bloating. If you had a lower endoscopy (such as a colonoscopy or flexible sigmoidoscopy) you may notice spotting of blood in your stool or on the toilet paper. If you underwent a bowel prep for your procedure, you may not have a normal bowel movement for a few days.  Please Note:  You might notice some irritation and congestion in your nose or some drainage.  This is from the oxygen used during your procedure.  There is no need for concern and it should clear up in a day or so.  SYMPTOMS TO REPORT IMMEDIATELY:   Following lower endoscopy (colonoscopy or flexible sigmoidoscopy):  Excessive amounts of blood in the stool  Significant tenderness or worsening of abdominal pains  Swelling of the abdomen that is new, acute  Fever of 100F or higher   For urgent or emergent issues, a gastroenterologist can be reached at any hour by calling [phone].   DIET: Your first meal following the procedure should be a small meal and then it is ok to progress to your normal diet. Heavy or fried foods are harder to digest and may make you feel nauseous or bloated.  Likewise, meals heavy in dairy and vegetables can increase bloating.   Drink plenty of fluids but you should avoid alcoholic beverages for 24 hours.  ACTIVITY:  You should plan to take it easy for the rest of today and you should NOT DRIVE or use heavy machinery until tomorrow (because of the sedation medicines used during the test).    FOLLOW UP: Our staff will call the number listed on your records the next business day following your procedure to check on you and address any questions or concerns that you may have regarding the information given to you following your procedure. If we do not reach you, we will leave a message.  However, if you are feeling well and you are not experiencing any problems, there is no need to return our call.  We will assume that you have returned to your regular daily activities without incident.  If any biopsies were taken you will be contacted by phone or by letter within the next 1-3 weeks.  Please call us at [phone] if you have not heard about the biopsies in 3 weeks.    SIGNATURES/CONFIDENTIALITY: You and/or your care partner have signed paperwork which will be entered into your electronic medical record.  These signatures attest to the fact that that the information above on your After Visit Summary has been reviewed and is understood.  Full responsibility of the confidentiality of this discharge information lies with you and/or your care-partner.

## 2015-10-17 ENCOUNTER — Telehealth: Payer: Self-pay

## 2015-10-17 NOTE — Telephone Encounter (Signed)
  Follow up Call-  Call back number 10/16/2015  Post procedure Call Back phone  # [phone]  Permission to leave phone message Yes     Patient questions:  Do you have a fever, pain , or abdominal swelling? No. Pain Score  0 *  Have you tolerated food without any problems? Yes.    Have you been able to return to your normal activities? Yes.    Do you have any questions about your discharge instructions: Diet   No. Medications  No. Follow up visit  No.  Do you have questions or concerns about your Care? No.  Actions: * If pain score is 4 or above: No action needed, pain <4.

## 2015-10-22 ENCOUNTER — Encounter: Payer: Self-pay | Admitting: Internal Medicine

## 2018-08-09 HISTORY — PX: SKIN CANCER EXCISION: SHX779

## 2018-09-14 DIAGNOSIS — Z794 Long term (current) use of insulin: Secondary | ICD-10-CM

## 2018-09-14 DIAGNOSIS — J309 Allergic rhinitis, unspecified: Secondary | ICD-10-CM

## 2018-09-14 DIAGNOSIS — I10 Essential (primary) hypertension: Secondary | ICD-10-CM | POA: Diagnosis not present

## 2018-09-14 DIAGNOSIS — I361 Nonrheumatic tricuspid (valve) insufficiency: Secondary | ICD-10-CM | POA: Diagnosis not present

## 2018-09-14 DIAGNOSIS — R42 Dizziness and giddiness: Secondary | ICD-10-CM

## 2018-09-14 DIAGNOSIS — E119 Type 2 diabetes mellitus without complications: Secondary | ICD-10-CM

## 2018-09-14 DIAGNOSIS — R55 Syncope and collapse: Secondary | ICD-10-CM

## 2018-09-15 DIAGNOSIS — R42 Dizziness and giddiness: Secondary | ICD-10-CM | POA: Diagnosis not present

## 2018-09-15 DIAGNOSIS — R55 Syncope and collapse: Secondary | ICD-10-CM | POA: Diagnosis not present

## 2018-09-15 DIAGNOSIS — E119 Type 2 diabetes mellitus without complications: Secondary | ICD-10-CM | POA: Diagnosis not present

## 2018-09-15 DIAGNOSIS — I10 Essential (primary) hypertension: Secondary | ICD-10-CM | POA: Diagnosis not present

## 2018-09-20 ENCOUNTER — Encounter: Payer: Self-pay | Admitting: *Deleted

## 2018-10-22 ENCOUNTER — Encounter: Payer: Self-pay | Admitting: Internal Medicine

## 2019-01-29 ENCOUNTER — Encounter: Payer: Self-pay | Admitting: Internal Medicine

## 2019-01-29 ENCOUNTER — Other Ambulatory Visit: Payer: Self-pay

## 2019-01-29 ENCOUNTER — Ambulatory Visit (AMBULATORY_SURGERY_CENTER): Payer: Self-pay

## 2019-01-29 VITALS — Ht 68.0 in | Wt 206.0 lb

## 2019-01-29 DIAGNOSIS — Z86010 Personal history of colonic polyps: Secondary | ICD-10-CM

## 2019-01-29 MED ORDER — NA SULFATE-K SULFATE-MG SULF 17.5-3.13-1.6 GM/177ML PO SOLN: 1.0000 | Freq: Once | ORAL | 0 refills | Status: AC

## 2019-01-29 NOTE — Progress Notes (Signed)
Denies allergies to eggs or soy products. Denies complication of anesthesia or sedation. Denies use of weight loss medication. Denies use of O2.   Emmi instructions given for colonoscopy.  Pre-Visit was conducted by phone due to Covid 19. Instructions were reviewed and mailed to patients confirmed home address. Patient was encouraged to call if he had any questions or concerns regarding instructions.

## 2019-02-06 ENCOUNTER — Telehealth: Payer: Self-pay | Admitting: Internal Medicine

## 2019-02-06 NOTE — Telephone Encounter (Signed)

## 2019-02-07 ENCOUNTER — Other Ambulatory Visit: Payer: Self-pay

## 2019-02-07 ENCOUNTER — Ambulatory Visit (AMBULATORY_SURGERY_CENTER): Payer: Medicare Other | Admitting: Internal Medicine

## 2019-02-07 ENCOUNTER — Encounter: Payer: Self-pay | Admitting: Internal Medicine

## 2019-02-07 VITALS — BP 123/39 | HR 74 | Temp 98.7°F | Resp 19 | Ht 69.0 in | Wt 206.0 lb

## 2019-02-07 DIAGNOSIS — D123 Benign neoplasm of transverse colon: Secondary | ICD-10-CM

## 2019-02-07 DIAGNOSIS — Z86010 Personal history of colonic polyps: Secondary | ICD-10-CM | POA: Diagnosis not present

## 2019-02-07 DIAGNOSIS — D120 Benign neoplasm of cecum: Secondary | ICD-10-CM

## 2019-02-07 MED ORDER — SODIUM CHLORIDE 0.9 % IV SOLN: 500.0000 mL | Freq: Once | INTRAVENOUS | Status: DC

## 2019-02-07 NOTE — Progress Notes (Signed)
Called to room to assist during endoscopic procedure.  Patient ID and intended procedure confirmed with present staff. Received instructions for my participation in the procedure from the performing physician.  

## 2019-02-07 NOTE — Op Note (Signed)
[place] Patient Name: Cody Mccann Procedure Date: 02/07/2019 8:16 AM MRN: [id number] Endoscopist: Jerene Bears , MD Age: 78 Referring MD:  Date of Birth: November 10, 1940 Gender: Male Account #: [id number] Procedure:                Colonoscopy Indications:              High risk colon cancer surveillance: Personal                            history of adenoma (10 mm or greater in size),                            Personal history of sessile serrated colon polyp                            (less than 10 mm in size) with no dysplasia, Last                            colonoscopy 3 years ago Medicines:                Monitored Anesthesia Care Procedure:                Pre-Anesthesia Assessment:                           - Prior to the procedure, a History and Physical                            was performed, and patient medications and                            allergies were reviewed. The patient's tolerance of                            previous anesthesia was also reviewed. The risks                            and benefits of the procedure and the sedation                            options and risks were discussed with the patient.                            All questions were answered, and informed consent                            was obtained. Prior Anticoagulants: The patient has                            taken no previous anticoagulant or antiplatelet                            agents. ASA Grade Assessment: II - A patient with  mild systemic disease. After reviewing the risks                            and benefits, the patient was deemed in                            satisfactory condition to undergo the procedure.                           After obtaining informed consent, the colonoscope                            was passed under direct vision. Throughout the                            procedure, the patient's blood pressure, pulse, and                             oxygen saturations were monitored continuously. The                            Colonoscope was introduced through the anus and                            advanced to the cecum, identified by appendiceal                            orifice and ileocecal valve. The colonoscopy was                            performed without difficulty. The patient tolerated                            the procedure well. The quality of the bowel                            preparation was good. The ileocecal valve,                            appendiceal orifice, and rectum were photographed. Scope In: 8:21:25 AM Scope Out: 8:41:03 AM Scope Withdrawal Time: 0 hours 16 minutes 7 seconds  Total Procedure Duration: 0 hours 19 minutes 38 seconds  Findings:                 The digital rectal exam was normal.                           A 6 mm polyp was found in the cecum. The polyp was                            sessile. The polyp was removed with a cold snare.                            Resection and retrieval were  complete.                           Three flat and sessile polyps were found in the                            transverse colon. The polyps were 3 to 6 mm in                            size. These polyps were removed with a cold snare.                            Resection and retrieval were complete.                           Multiple small and large-mouthed diverticula were                            found in the sigmoid colon and descending colon.                           The retroflexed view of the distal rectum and anal                            verge was normal and showed no anal or rectal                            abnormalities. Complications:            No immediate complications. Estimated Blood Loss:     Estimated blood loss was minimal. Impression:               - One 6 mm polyp in the cecum, removed with a cold                            snare. Resected and  retrieved.                           - Three 3 to 6 mm polyps in the transverse colon,                            removed with a cold snare. Resected and retrieved.                           - Diverticulosis in the sigmoid colon and in the                            descending colon.                           - The distal rectum and anal verge are normal on                            retroflexion view. Recommendation:           -  Patient has a contact number available for                            emergencies. The signs and symptoms of potential                            delayed complications were discussed with the                            patient. Return to normal activities tomorrow.                            Written discharge instructions were provided to the                            patient.                           - Resume previous diet.                           - Continue present medications.                           - Await pathology results.                           - Repeat colonoscopy is recommended. The                            colonoscopy date will be determined after pathology                            results from today's exam become available for                            review. Jerene Bears, MD 02/07/2019 8:46:28 AM This report has been signed electronically.

## 2019-02-07 NOTE — Progress Notes (Signed)
Report to PACU, RN, vss, BBS= Clear.  

## 2019-02-07 NOTE — Patient Instructions (Signed)
Handouts given for polyps and diverticulosis.  YOU HAD AN ENDOSCOPIC PROCEDURE TODAY AT THE [facility]:   Refer to the procedure report that was given to you for any specific questions about what was found during the examination.  If the procedure report does not answer your questions, please call your gastroenterologist to clarify.  If you requested that your care partner not be given the details of your procedure findings, then the procedure report has been included in a sealed envelope for you to review at your convenience later.  YOU SHOULD EXPECT: Some feelings of bloating in the abdomen. Passage of more gas than usual.  Walking can help get rid of the air that was put into your GI tract during the procedure and reduce the bloating. If you had a lower endoscopy (such as a colonoscopy or flexible sigmoidoscopy) you may notice spotting of blood in your stool or on the toilet paper. If you underwent a bowel prep for your procedure, you may not have a normal bowel movement for a few days.  Please Note:  You might notice some irritation and congestion in your nose or some drainage.  This is from the oxygen used during your procedure.  There is no need for concern and it should clear up in a day or so.  SYMPTOMS TO REPORT IMMEDIATELY:   Following lower endoscopy (colonoscopy or flexible sigmoidoscopy):  Excessive amounts of blood in the stool  Significant tenderness or worsening of abdominal pains  Swelling of the abdomen that is new, acute  Fever of 100F or higher  For urgent or emergent issues, a gastroenterologist can be reached at any hour by calling [phone].   DIET:  We do recommend a small meal at first, but then you may proceed to your regular diet.  Drink plenty of fluids but you should avoid alcoholic beverages for 24 hours.  ACTIVITY:  You should plan to take it easy for the rest of today and you should NOT DRIVE or use heavy machinery until tomorrow (because of  the sedation medicines used during the test).    FOLLOW UP: Our staff will call the number listed on your records 48-72 hours following your procedure to check on you and address any questions or concerns that you may have regarding the information given to you following your procedure. If we do not reach you, we will leave a message.  We will attempt to reach you two times.  During this call, we will ask if you have developed any symptoms of COVID 19. If you develop any symptoms (ie: fever, flu-like symptoms, shortness of breath, cough etc.) before then, please call [phone].  If you test positive for Covid 19 in the 2 weeks post procedure, please call and report this information to us.    If any biopsies were taken you will be contacted by phone or by letter within the next 1-3 weeks.  Please call us at [phone] if you have not heard about the biopsies in 3 weeks.    SIGNATURES/CONFIDENTIALITY: You and/or your care partner have signed paperwork which will be entered into your electronic medical record.  These signatures attest to the fact that that the information above on your After Visit Summary has been reviewed and is understood.  Full responsibility of the confidentiality of this discharge information lies with you and/or your care-partner. 

## 2019-02-07 NOTE — Progress Notes (Signed)
Pt's states no medical or surgical changes since previsit or office visit.  Temps taken by CW V/S taken by JB

## 2019-02-12 ENCOUNTER — Telehealth: Payer: Self-pay | Admitting: *Deleted

## 2019-02-12 NOTE — Telephone Encounter (Signed)
  Follow up Call-  Call back number 02/07/2019  Post procedure Call Back phone  # [phone]  Permission to leave phone message Yes  Some recent data might be hidden     Patient questions:  Do you have a fever, pain , or abdominal swelling? No. Pain Score  0 *  Have you tolerated food without any problems? Yes.    Have you been able to return to your normal activities? Yes.    Do you have any questions about your discharge instructions: Diet   No. Medications  No. Follow up visit  No.  Do you have questions or concerns about your Care? No.  Actions: * If pain score is 4 or above: No action needed, pain <4.  1. Have you developed a fever since your procedure? no  2.   Have you had an respiratory symptoms (SOB or cough) since your procedure? *no  3.   Have you tested positive for COVID 19 since your procedure? no  4.   Have you had any family members/close contacts diagnosed with the COVID 19 since your procedure?  no   If yes to any of these questions please route to Joylene John, RN and Alphonsa Gin, Therapist, sports.

## 2019-02-13 ENCOUNTER — Encounter: Payer: Self-pay | Admitting: Internal Medicine

## 2019-09-05 DIAGNOSIS — I10 Essential (primary) hypertension: Secondary | ICD-10-CM | POA: Diagnosis not present

## 2019-09-05 DIAGNOSIS — Z794 Long term (current) use of insulin: Secondary | ICD-10-CM | POA: Diagnosis not present

## 2019-09-05 DIAGNOSIS — E119 Type 2 diabetes mellitus without complications: Secondary | ICD-10-CM | POA: Diagnosis not present

## 2019-09-05 DIAGNOSIS — E785 Hyperlipidemia, unspecified: Secondary | ICD-10-CM | POA: Diagnosis not present

## 2019-11-14 DIAGNOSIS — M25561 Pain in right knee: Secondary | ICD-10-CM | POA: Diagnosis not present

## 2019-11-30 DIAGNOSIS — E1169 Type 2 diabetes mellitus with other specified complication: Secondary | ICD-10-CM | POA: Diagnosis not present

## 2019-11-30 DIAGNOSIS — E785 Hyperlipidemia, unspecified: Secondary | ICD-10-CM | POA: Diagnosis not present

## 2019-11-30 DIAGNOSIS — Z79899 Other long term (current) drug therapy: Secondary | ICD-10-CM | POA: Diagnosis not present

## 2019-12-12 DIAGNOSIS — M1711 Unilateral primary osteoarthritis, right knee: Secondary | ICD-10-CM | POA: Diagnosis not present

## 2019-12-13 DIAGNOSIS — M1711 Unilateral primary osteoarthritis, right knee: Secondary | ICD-10-CM | POA: Diagnosis not present

## 2019-12-13 DIAGNOSIS — Z0000 Encounter for general adult medical examination without abnormal findings: Secondary | ICD-10-CM | POA: Diagnosis not present

## 2019-12-13 DIAGNOSIS — K219 Gastro-esophageal reflux disease without esophagitis: Secondary | ICD-10-CM | POA: Diagnosis not present

## 2019-12-13 DIAGNOSIS — Z6830 Body mass index (BMI) 30.0-30.9, adult: Secondary | ICD-10-CM | POA: Diagnosis not present

## 2019-12-13 DIAGNOSIS — I10 Essential (primary) hypertension: Secondary | ICD-10-CM | POA: Diagnosis not present

## 2019-12-13 DIAGNOSIS — E669 Obesity, unspecified: Secondary | ICD-10-CM | POA: Diagnosis not present

## 2019-12-13 DIAGNOSIS — E785 Hyperlipidemia, unspecified: Secondary | ICD-10-CM | POA: Diagnosis not present

## 2019-12-13 DIAGNOSIS — I679 Cerebrovascular disease, unspecified: Secondary | ICD-10-CM | POA: Diagnosis not present

## 2019-12-13 DIAGNOSIS — E1169 Type 2 diabetes mellitus with other specified complication: Secondary | ICD-10-CM | POA: Diagnosis not present

## 2019-12-28 DIAGNOSIS — F418 Other specified anxiety disorders: Secondary | ICD-10-CM | POA: Diagnosis not present

## 2020-01-03 DIAGNOSIS — M1711 Unilateral primary osteoarthritis, right knee: Secondary | ICD-10-CM | POA: Diagnosis not present

## 2020-01-10 DIAGNOSIS — M1711 Unilateral primary osteoarthritis, right knee: Secondary | ICD-10-CM | POA: Diagnosis not present

## 2020-01-21 DIAGNOSIS — M1711 Unilateral primary osteoarthritis, right knee: Secondary | ICD-10-CM | POA: Diagnosis not present

## 2020-02-07 DIAGNOSIS — H5211 Myopia, right eye: Secondary | ICD-10-CM | POA: Diagnosis not present

## 2020-02-07 DIAGNOSIS — E119 Type 2 diabetes mellitus without complications: Secondary | ICD-10-CM | POA: Diagnosis not present

## 2020-02-07 DIAGNOSIS — Z961 Presence of intraocular lens: Secondary | ICD-10-CM | POA: Diagnosis not present

## 2020-02-07 DIAGNOSIS — Z7984 Long term (current) use of oral hypoglycemic drugs: Secondary | ICD-10-CM | POA: Diagnosis not present

## 2020-02-07 DIAGNOSIS — Z794 Long term (current) use of insulin: Secondary | ICD-10-CM | POA: Diagnosis not present

## 2020-02-07 DIAGNOSIS — H26492 Other secondary cataract, left eye: Secondary | ICD-10-CM | POA: Diagnosis not present

## 2020-02-07 DIAGNOSIS — H353111 Nonexudative age-related macular degeneration, right eye, early dry stage: Secondary | ICD-10-CM | POA: Diagnosis not present

## 2020-02-07 DIAGNOSIS — H5202 Hypermetropia, left eye: Secondary | ICD-10-CM | POA: Diagnosis not present

## 2020-02-07 DIAGNOSIS — H26491 Other secondary cataract, right eye: Secondary | ICD-10-CM | POA: Diagnosis not present

## 2020-02-18 DIAGNOSIS — L814 Other melanin hyperpigmentation: Secondary | ICD-10-CM | POA: Diagnosis not present

## 2020-02-18 DIAGNOSIS — Z85828 Personal history of other malignant neoplasm of skin: Secondary | ICD-10-CM | POA: Diagnosis not present

## 2020-02-18 DIAGNOSIS — L820 Inflamed seborrheic keratosis: Secondary | ICD-10-CM | POA: Diagnosis not present

## 2020-02-18 DIAGNOSIS — L821 Other seborrheic keratosis: Secondary | ICD-10-CM | POA: Diagnosis not present

## 2020-02-18 DIAGNOSIS — D485 Neoplasm of uncertain behavior of skin: Secondary | ICD-10-CM | POA: Diagnosis not present

## 2020-02-18 DIAGNOSIS — C44612 Basal cell carcinoma of skin of right upper limb, including shoulder: Secondary | ICD-10-CM | POA: Diagnosis not present

## 2020-02-18 DIAGNOSIS — L905 Scar conditions and fibrosis of skin: Secondary | ICD-10-CM | POA: Diagnosis not present

## 2020-02-18 DIAGNOSIS — C44719 Basal cell carcinoma of skin of left lower limb, including hip: Secondary | ICD-10-CM | POA: Diagnosis not present

## 2020-03-04 DIAGNOSIS — E119 Type 2 diabetes mellitus without complications: Secondary | ICD-10-CM | POA: Diagnosis not present

## 2020-03-04 DIAGNOSIS — I10 Essential (primary) hypertension: Secondary | ICD-10-CM | POA: Diagnosis not present

## 2020-03-04 DIAGNOSIS — Z6829 Body mass index (BMI) 29.0-29.9, adult: Secondary | ICD-10-CM | POA: Diagnosis not present

## 2020-03-04 DIAGNOSIS — E669 Obesity, unspecified: Secondary | ICD-10-CM | POA: Diagnosis not present

## 2020-03-04 DIAGNOSIS — Z794 Long term (current) use of insulin: Secondary | ICD-10-CM | POA: Diagnosis not present

## 2020-03-04 DIAGNOSIS — E785 Hyperlipidemia, unspecified: Secondary | ICD-10-CM | POA: Diagnosis not present

## 2020-03-07 DIAGNOSIS — H52223 Regular astigmatism, bilateral: Secondary | ICD-10-CM | POA: Diagnosis not present

## 2020-03-07 DIAGNOSIS — H5211 Myopia, right eye: Secondary | ICD-10-CM | POA: Diagnosis not present

## 2020-03-07 DIAGNOSIS — H26491 Other secondary cataract, right eye: Secondary | ICD-10-CM | POA: Diagnosis not present

## 2020-03-07 DIAGNOSIS — Z961 Presence of intraocular lens: Secondary | ICD-10-CM | POA: Diagnosis not present

## 2020-03-07 DIAGNOSIS — Z9841 Cataract extraction status, right eye: Secondary | ICD-10-CM | POA: Diagnosis not present

## 2020-03-07 DIAGNOSIS — H524 Presbyopia: Secondary | ICD-10-CM | POA: Diagnosis not present

## 2020-03-07 DIAGNOSIS — H5202 Hypermetropia, left eye: Secondary | ICD-10-CM | POA: Diagnosis not present

## 2020-03-25 DIAGNOSIS — C44719 Basal cell carcinoma of skin of left lower limb, including hip: Secondary | ICD-10-CM | POA: Diagnosis not present

## 2020-04-04 DIAGNOSIS — C44612 Basal cell carcinoma of skin of right upper limb, including shoulder: Secondary | ICD-10-CM | POA: Diagnosis not present

## 2020-05-09 DIAGNOSIS — S81802D Unspecified open wound, left lower leg, subsequent encounter: Secondary | ICD-10-CM | POA: Diagnosis not present

## 2020-06-02 DIAGNOSIS — N401 Enlarged prostate with lower urinary tract symptoms: Secondary | ICD-10-CM | POA: Diagnosis not present

## 2020-06-02 DIAGNOSIS — N529 Male erectile dysfunction, unspecified: Secondary | ICD-10-CM | POA: Diagnosis not present

## 2020-06-10 DIAGNOSIS — E785 Hyperlipidemia, unspecified: Secondary | ICD-10-CM | POA: Diagnosis not present

## 2020-06-10 DIAGNOSIS — E1169 Type 2 diabetes mellitus with other specified complication: Secondary | ICD-10-CM | POA: Diagnosis not present

## 2020-06-10 DIAGNOSIS — Z125 Encounter for screening for malignant neoplasm of prostate: Secondary | ICD-10-CM | POA: Diagnosis not present

## 2020-06-10 DIAGNOSIS — Z79899 Other long term (current) drug therapy: Secondary | ICD-10-CM | POA: Diagnosis not present

## 2020-06-12 DIAGNOSIS — S81801D Unspecified open wound, right lower leg, subsequent encounter: Secondary | ICD-10-CM | POA: Diagnosis not present

## 2020-06-17 DIAGNOSIS — E1169 Type 2 diabetes mellitus with other specified complication: Secondary | ICD-10-CM | POA: Diagnosis not present

## 2020-06-17 DIAGNOSIS — H6123 Impacted cerumen, bilateral: Secondary | ICD-10-CM | POA: Diagnosis not present

## 2020-06-17 DIAGNOSIS — E669 Obesity, unspecified: Secondary | ICD-10-CM | POA: Diagnosis not present

## 2020-06-17 DIAGNOSIS — E785 Hyperlipidemia, unspecified: Secondary | ICD-10-CM | POA: Diagnosis not present

## 2020-06-17 DIAGNOSIS — M1711 Unilateral primary osteoarthritis, right knee: Secondary | ICD-10-CM | POA: Diagnosis not present

## 2020-06-17 DIAGNOSIS — F411 Generalized anxiety disorder: Secondary | ICD-10-CM | POA: Diagnosis not present

## 2020-06-17 DIAGNOSIS — I672 Cerebral atherosclerosis: Secondary | ICD-10-CM | POA: Diagnosis not present

## 2020-06-17 DIAGNOSIS — I10 Essential (primary) hypertension: Secondary | ICD-10-CM | POA: Diagnosis not present

## 2020-06-17 DIAGNOSIS — K219 Gastro-esophageal reflux disease without esophagitis: Secondary | ICD-10-CM | POA: Diagnosis not present

## 2020-07-05 DIAGNOSIS — Z20828 Contact with and (suspected) exposure to other viral communicable diseases: Secondary | ICD-10-CM | POA: Diagnosis not present

## 2020-07-05 DIAGNOSIS — R051 Acute cough: Secondary | ICD-10-CM | POA: Diagnosis not present

## 2020-09-09 DIAGNOSIS — I10 Essential (primary) hypertension: Secondary | ICD-10-CM | POA: Diagnosis not present

## 2020-09-09 DIAGNOSIS — E119 Type 2 diabetes mellitus without complications: Secondary | ICD-10-CM | POA: Diagnosis not present

## 2020-09-09 DIAGNOSIS — E785 Hyperlipidemia, unspecified: Secondary | ICD-10-CM | POA: Diagnosis not present

## 2020-09-09 DIAGNOSIS — Z794 Long term (current) use of insulin: Secondary | ICD-10-CM | POA: Diagnosis not present

## 2020-11-10 DIAGNOSIS — L720 Epidermal cyst: Secondary | ICD-10-CM | POA: Diagnosis not present

## 2020-11-10 DIAGNOSIS — L02212 Cutaneous abscess of back [any part, except buttock]: Secondary | ICD-10-CM | POA: Diagnosis not present

## 2020-12-18 DIAGNOSIS — K2960 Other gastritis without bleeding: Secondary | ICD-10-CM | POA: Diagnosis not present

## 2020-12-18 DIAGNOSIS — M1711 Unilateral primary osteoarthritis, right knee: Secondary | ICD-10-CM | POA: Diagnosis not present

## 2020-12-18 DIAGNOSIS — E785 Hyperlipidemia, unspecified: Secondary | ICD-10-CM | POA: Diagnosis not present

## 2020-12-18 DIAGNOSIS — F411 Generalized anxiety disorder: Secondary | ICD-10-CM | POA: Diagnosis not present

## 2020-12-18 DIAGNOSIS — E669 Obesity, unspecified: Secondary | ICD-10-CM | POA: Diagnosis not present

## 2020-12-18 DIAGNOSIS — I10 Essential (primary) hypertension: Secondary | ICD-10-CM | POA: Diagnosis not present

## 2020-12-18 DIAGNOSIS — I672 Cerebral atherosclerosis: Secondary | ICD-10-CM | POA: Diagnosis not present

## 2020-12-18 DIAGNOSIS — E1169 Type 2 diabetes mellitus with other specified complication: Secondary | ICD-10-CM | POA: Diagnosis not present

## 2020-12-18 DIAGNOSIS — Z0000 Encounter for general adult medical examination without abnormal findings: Secondary | ICD-10-CM | POA: Diagnosis not present

## 2021-02-11 DIAGNOSIS — L821 Other seborrheic keratosis: Secondary | ICD-10-CM | POA: Diagnosis not present

## 2021-02-11 DIAGNOSIS — D225 Melanocytic nevi of trunk: Secondary | ICD-10-CM | POA: Diagnosis not present

## 2021-02-11 DIAGNOSIS — D485 Neoplasm of uncertain behavior of skin: Secondary | ICD-10-CM | POA: Diagnosis not present

## 2021-02-16 DIAGNOSIS — Z7984 Long term (current) use of oral hypoglycemic drugs: Secondary | ICD-10-CM | POA: Diagnosis not present

## 2021-02-16 DIAGNOSIS — H353111 Nonexudative age-related macular degeneration, right eye, early dry stage: Secondary | ICD-10-CM | POA: Diagnosis not present

## 2021-02-16 DIAGNOSIS — Z794 Long term (current) use of insulin: Secondary | ICD-10-CM | POA: Diagnosis not present

## 2021-02-16 DIAGNOSIS — E119 Type 2 diabetes mellitus without complications: Secondary | ICD-10-CM | POA: Diagnosis not present

## 2021-02-16 DIAGNOSIS — H26492 Other secondary cataract, left eye: Secondary | ICD-10-CM | POA: Diagnosis not present

## 2021-02-16 DIAGNOSIS — Z9841 Cataract extraction status, right eye: Secondary | ICD-10-CM | POA: Diagnosis not present

## 2021-02-16 DIAGNOSIS — H524 Presbyopia: Secondary | ICD-10-CM | POA: Diagnosis not present

## 2021-02-16 DIAGNOSIS — Z961 Presence of intraocular lens: Secondary | ICD-10-CM | POA: Diagnosis not present

## 2021-02-16 DIAGNOSIS — H52222 Regular astigmatism, left eye: Secondary | ICD-10-CM | POA: Diagnosis not present

## 2021-02-28 DIAGNOSIS — R051 Acute cough: Secondary | ICD-10-CM | POA: Diagnosis not present

## 2021-02-28 DIAGNOSIS — Z20828 Contact with and (suspected) exposure to other viral communicable diseases: Secondary | ICD-10-CM | POA: Diagnosis not present

## 2021-03-10 DIAGNOSIS — Z7984 Long term (current) use of oral hypoglycemic drugs: Secondary | ICD-10-CM | POA: Diagnosis not present

## 2021-03-10 DIAGNOSIS — E1169 Type 2 diabetes mellitus with other specified complication: Secondary | ICD-10-CM | POA: Diagnosis not present

## 2021-03-10 DIAGNOSIS — E785 Hyperlipidemia, unspecified: Secondary | ICD-10-CM | POA: Diagnosis not present

## 2021-03-10 DIAGNOSIS — I10 Essential (primary) hypertension: Secondary | ICD-10-CM | POA: Diagnosis not present

## 2021-03-10 DIAGNOSIS — Z794 Long term (current) use of insulin: Secondary | ICD-10-CM | POA: Diagnosis not present

## 2021-06-02 DIAGNOSIS — M7062 Trochanteric bursitis, left hip: Secondary | ICD-10-CM | POA: Diagnosis not present

## 2021-06-18 DIAGNOSIS — N401 Enlarged prostate with lower urinary tract symptoms: Secondary | ICD-10-CM | POA: Diagnosis not present

## 2021-06-18 DIAGNOSIS — N529 Male erectile dysfunction, unspecified: Secondary | ICD-10-CM | POA: Diagnosis not present

## 2021-06-19 DIAGNOSIS — E1169 Type 2 diabetes mellitus with other specified complication: Secondary | ICD-10-CM | POA: Diagnosis not present

## 2021-06-19 DIAGNOSIS — E785 Hyperlipidemia, unspecified: Secondary | ICD-10-CM | POA: Diagnosis not present

## 2021-06-29 DIAGNOSIS — M8580 Other specified disorders of bone density and structure, unspecified site: Secondary | ICD-10-CM | POA: Diagnosis not present

## 2021-06-29 DIAGNOSIS — M1711 Unilateral primary osteoarthritis, right knee: Secondary | ICD-10-CM | POA: Diagnosis not present

## 2021-06-29 DIAGNOSIS — I10 Essential (primary) hypertension: Secondary | ICD-10-CM | POA: Diagnosis not present

## 2021-06-29 DIAGNOSIS — F411 Generalized anxiety disorder: Secondary | ICD-10-CM | POA: Diagnosis not present

## 2021-06-29 DIAGNOSIS — M8589 Other specified disorders of bone density and structure, multiple sites: Secondary | ICD-10-CM | POA: Diagnosis not present

## 2021-06-29 DIAGNOSIS — I672 Cerebral atherosclerosis: Secondary | ICD-10-CM | POA: Diagnosis not present

## 2021-06-29 DIAGNOSIS — E785 Hyperlipidemia, unspecified: Secondary | ICD-10-CM | POA: Diagnosis not present

## 2021-06-29 DIAGNOSIS — D638 Anemia in other chronic diseases classified elsewhere: Secondary | ICD-10-CM | POA: Diagnosis not present

## 2021-06-29 DIAGNOSIS — K2960 Other gastritis without bleeding: Secondary | ICD-10-CM | POA: Diagnosis not present

## 2021-06-29 DIAGNOSIS — E1169 Type 2 diabetes mellitus with other specified complication: Secondary | ICD-10-CM | POA: Diagnosis not present

## 2021-09-14 DIAGNOSIS — L0211 Cutaneous abscess of neck: Secondary | ICD-10-CM | POA: Diagnosis not present

## 2021-09-14 DIAGNOSIS — L570 Actinic keratosis: Secondary | ICD-10-CM | POA: Diagnosis not present

## 2021-09-14 DIAGNOSIS — L814 Other melanin hyperpigmentation: Secondary | ICD-10-CM | POA: Diagnosis not present

## 2021-09-14 DIAGNOSIS — L578 Other skin changes due to chronic exposure to nonionizing radiation: Secondary | ICD-10-CM | POA: Diagnosis not present

## 2021-09-15 DIAGNOSIS — E785 Hyperlipidemia, unspecified: Secondary | ICD-10-CM | POA: Diagnosis not present

## 2021-09-15 DIAGNOSIS — Z794 Long term (current) use of insulin: Secondary | ICD-10-CM | POA: Diagnosis not present

## 2021-09-15 DIAGNOSIS — Z7984 Long term (current) use of oral hypoglycemic drugs: Secondary | ICD-10-CM | POA: Diagnosis not present

## 2021-09-15 DIAGNOSIS — E119 Type 2 diabetes mellitus without complications: Secondary | ICD-10-CM | POA: Diagnosis not present

## 2021-09-15 DIAGNOSIS — I10 Essential (primary) hypertension: Secondary | ICD-10-CM | POA: Diagnosis not present

## 2021-10-16 DIAGNOSIS — E1169 Type 2 diabetes mellitus with other specified complication: Secondary | ICD-10-CM | POA: Diagnosis not present

## 2021-10-16 DIAGNOSIS — I10 Essential (primary) hypertension: Secondary | ICD-10-CM | POA: Diagnosis not present

## 2021-10-16 DIAGNOSIS — Z6832 Body mass index (BMI) 32.0-32.9, adult: Secondary | ICD-10-CM | POA: Diagnosis not present

## 2021-10-16 DIAGNOSIS — E669 Obesity, unspecified: Secondary | ICD-10-CM | POA: Diagnosis not present

## 2021-10-16 DIAGNOSIS — E785 Hyperlipidemia, unspecified: Secondary | ICD-10-CM | POA: Diagnosis not present

## 2021-10-16 DIAGNOSIS — H109 Unspecified conjunctivitis: Secondary | ICD-10-CM | POA: Diagnosis not present

## 2021-12-15 DIAGNOSIS — M8580 Other specified disorders of bone density and structure, unspecified site: Secondary | ICD-10-CM | POA: Diagnosis not present

## 2021-12-15 DIAGNOSIS — E785 Hyperlipidemia, unspecified: Secondary | ICD-10-CM | POA: Diagnosis not present

## 2021-12-15 DIAGNOSIS — E1169 Type 2 diabetes mellitus with other specified complication: Secondary | ICD-10-CM | POA: Diagnosis not present

## 2021-12-22 DIAGNOSIS — Z0000 Encounter for general adult medical examination without abnormal findings: Secondary | ICD-10-CM | POA: Diagnosis not present

## 2021-12-22 DIAGNOSIS — M8580 Other specified disorders of bone density and structure, unspecified site: Secondary | ICD-10-CM | POA: Diagnosis not present

## 2021-12-22 DIAGNOSIS — D638 Anemia in other chronic diseases classified elsewhere: Secondary | ICD-10-CM | POA: Diagnosis not present

## 2021-12-22 DIAGNOSIS — M1711 Unilateral primary osteoarthritis, right knee: Secondary | ICD-10-CM | POA: Diagnosis not present

## 2021-12-22 DIAGNOSIS — E1169 Type 2 diabetes mellitus with other specified complication: Secondary | ICD-10-CM | POA: Diagnosis not present

## 2021-12-22 DIAGNOSIS — K2960 Other gastritis without bleeding: Secondary | ICD-10-CM | POA: Diagnosis not present

## 2021-12-22 DIAGNOSIS — I10 Essential (primary) hypertension: Secondary | ICD-10-CM | POA: Diagnosis not present

## 2021-12-22 DIAGNOSIS — I672 Cerebral atherosclerosis: Secondary | ICD-10-CM | POA: Diagnosis not present

## 2021-12-22 DIAGNOSIS — E785 Hyperlipidemia, unspecified: Secondary | ICD-10-CM | POA: Diagnosis not present

## 2022-02-24 DIAGNOSIS — H52222 Regular astigmatism, left eye: Secondary | ICD-10-CM | POA: Diagnosis not present

## 2022-02-24 DIAGNOSIS — H524 Presbyopia: Secondary | ICD-10-CM | POA: Diagnosis not present

## 2022-02-24 DIAGNOSIS — E119 Type 2 diabetes mellitus without complications: Secondary | ICD-10-CM | POA: Diagnosis not present

## 2022-02-24 DIAGNOSIS — Z794 Long term (current) use of insulin: Secondary | ICD-10-CM | POA: Diagnosis not present

## 2022-02-24 DIAGNOSIS — H26492 Other secondary cataract, left eye: Secondary | ICD-10-CM | POA: Diagnosis not present

## 2022-02-24 DIAGNOSIS — H353111 Nonexudative age-related macular degeneration, right eye, early dry stage: Secondary | ICD-10-CM | POA: Diagnosis not present

## 2022-02-24 DIAGNOSIS — Z7984 Long term (current) use of oral hypoglycemic drugs: Secondary | ICD-10-CM | POA: Diagnosis not present

## 2022-02-24 DIAGNOSIS — Z9841 Cataract extraction status, right eye: Secondary | ICD-10-CM | POA: Diagnosis not present

## 2022-02-24 DIAGNOSIS — H3530 Unspecified macular degeneration: Secondary | ICD-10-CM | POA: Diagnosis not present

## 2022-03-16 DIAGNOSIS — Z794 Long term (current) use of insulin: Secondary | ICD-10-CM | POA: Diagnosis not present

## 2022-03-16 DIAGNOSIS — E119 Type 2 diabetes mellitus without complications: Secondary | ICD-10-CM | POA: Diagnosis not present

## 2022-03-16 DIAGNOSIS — I10 Essential (primary) hypertension: Secondary | ICD-10-CM | POA: Diagnosis not present

## 2022-03-16 DIAGNOSIS — E785 Hyperlipidemia, unspecified: Secondary | ICD-10-CM | POA: Diagnosis not present

## 2022-03-16 DIAGNOSIS — Z7984 Long term (current) use of oral hypoglycemic drugs: Secondary | ICD-10-CM | POA: Diagnosis not present

## 2022-03-25 DIAGNOSIS — E119 Type 2 diabetes mellitus without complications: Secondary | ICD-10-CM | POA: Diagnosis not present

## 2022-04-13 DIAGNOSIS — C44712 Basal cell carcinoma of skin of right lower limb, including hip: Secondary | ICD-10-CM | POA: Diagnosis not present

## 2022-04-19 ENCOUNTER — Telehealth: Payer: Self-pay

## 2022-04-19 NOTE — Telephone Encounter (Signed)
Dr. Hilarie Fredrickson, This patient is 66 years young and has a recall for colonoscopy in place, however, there is not a recall assessment sheet in Epic.  Do you wish to have the patient schedule an OV or be a direct recall colon? Please advise Thank you

## 2022-04-19 NOTE — Telephone Encounter (Signed)
If patient willing he can proceed with surveillance colonoscopy based on personal history of colon polyps without office visit

## 2022-04-23 NOTE — Telephone Encounter (Signed)
Noted on PV chart ?

## 2022-04-25 DIAGNOSIS — E119 Type 2 diabetes mellitus without complications: Secondary | ICD-10-CM | POA: Diagnosis not present

## 2022-04-29 DIAGNOSIS — E119 Type 2 diabetes mellitus without complications: Secondary | ICD-10-CM | POA: Diagnosis not present

## 2022-04-29 DIAGNOSIS — Z794 Long term (current) use of insulin: Secondary | ICD-10-CM | POA: Diagnosis not present

## 2022-05-03 ENCOUNTER — Ambulatory Visit (AMBULATORY_SURGERY_CENTER): Payer: Self-pay

## 2022-05-03 VITALS — Ht 67.0 in | Wt 211.0 lb

## 2022-05-03 DIAGNOSIS — Z86010 Personal history of colonic polyps: Secondary | ICD-10-CM

## 2022-05-03 MED ORDER — NA SULFATE-K SULFATE-MG SULF 17.5-3.13-1.6 GM/177ML PO SOLN: 1.0000 | ORAL | 0 refills | Status: DC

## 2022-05-03 NOTE — Progress Notes (Signed)
No egg or soy allergy known to patient  No issues known to pt with past sedation with any surgeries or procedures Patient denies ever being told they had issues or difficulty with intubation  No FH of Malignant Hyperthermia Pt is not on diet pills Pt is not on  home 02  Pt is not on blood thinners  Pt denies issues with constipation  No A fib or A flutter Have any cardiac testing pending--denied Pt instructed to use Singlecare.com or GoodRx for a price reduction on prep   

## 2022-05-04 ENCOUNTER — Encounter: Payer: Self-pay | Admitting: Internal Medicine

## 2022-05-11 DIAGNOSIS — Z794 Long term (current) use of insulin: Secondary | ICD-10-CM | POA: Diagnosis not present

## 2022-05-11 DIAGNOSIS — E1165 Type 2 diabetes mellitus with hyperglycemia: Secondary | ICD-10-CM | POA: Diagnosis not present

## 2022-05-17 ENCOUNTER — Ambulatory Visit (AMBULATORY_SURGERY_CENTER): Payer: Medicare PPO | Admitting: Internal Medicine

## 2022-05-17 ENCOUNTER — Encounter: Payer: Self-pay | Admitting: Internal Medicine

## 2022-05-17 VITALS — BP 108/58 | HR 57 | Temp 98.6°F | Resp 13 | Ht 67.0 in | Wt 211.0 lb

## 2022-05-17 DIAGNOSIS — D122 Benign neoplasm of ascending colon: Secondary | ICD-10-CM | POA: Diagnosis not present

## 2022-05-17 DIAGNOSIS — K635 Polyp of colon: Secondary | ICD-10-CM | POA: Diagnosis not present

## 2022-05-17 DIAGNOSIS — D120 Benign neoplasm of cecum: Secondary | ICD-10-CM

## 2022-05-17 DIAGNOSIS — Z09 Encounter for follow-up examination after completed treatment for conditions other than malignant neoplasm: Secondary | ICD-10-CM | POA: Diagnosis not present

## 2022-05-17 DIAGNOSIS — D123 Benign neoplasm of transverse colon: Secondary | ICD-10-CM | POA: Diagnosis not present

## 2022-05-17 DIAGNOSIS — Z120 Encounter for screening for malignant neoplasm of stomach: Secondary | ICD-10-CM

## 2022-05-17 DIAGNOSIS — Z86010 Personal history of colonic polyps: Secondary | ICD-10-CM | POA: Diagnosis not present

## 2022-05-17 DIAGNOSIS — D124 Benign neoplasm of descending colon: Secondary | ICD-10-CM

## 2022-05-17 MED ORDER — SODIUM CHLORIDE 0.9 % IV SOLN: 500.0000 mL | INTRAVENOUS | Status: DC

## 2022-05-17 NOTE — Op Note (Signed)
[place] Patient Name: Cody Mccann Procedure Date: 05/17/2022 8:28 AM MRN: [id number] Endoscopist: Jerene Bears , MD Age: 81 Referring MD:  Date of Birth: 01/12/1941 Gender: Male Account #: [id number] Procedure:                Colonoscopy Indications:              High risk colon cancer surveillance: Personal                            history of multiple adenomas and SSPs (including                            adenomas > 1 cm), Last colonoscopy: July 2020 Medicines:                Monitored Anesthesia Care Procedure:                Pre-Anesthesia Assessment:                           - Prior to the procedure, a History and Physical                            was performed, and patient medications and                            allergies were reviewed. The patient's tolerance of                            previous anesthesia was also reviewed. The risks                            and benefits of the procedure and the sedation                            options and risks were discussed with the patient.                            All questions were answered, and informed consent                            was obtained. Prior Anticoagulants: The patient has                            taken no previous anticoagulant or antiplatelet                            agents. ASA Grade Assessment: III - A patient with                            severe systemic disease. After reviewing the risks                            and benefits, the patient was deemed in  satisfactory condition to undergo the procedure.                           After obtaining informed consent, the colonoscope                            was passed under direct vision. Throughout the                            procedure, the patient's blood pressure, pulse, and                            oxygen saturations were monitored continuously. The                            CF [id number] #[id number] was  introduced through the anus                            and advanced to the cecum, identified by                            appendiceal orifice and ileocecal valve. The                            colonoscopy was performed without difficulty. The                            patient tolerated the procedure well. The quality                            of the bowel preparation was good. The ileocecal                            valve, appendiceal orifice, and rectum were                            photographed. Scope In: 8:40:35 AM Scope Out: 9:01:47 AM Scope Withdrawal Time: 0 hours 18 minutes 17 seconds  Total Procedure Duration: 0 hours 21 minutes 12 seconds  Findings:                 The digital rectal exam was normal.                           A 5 mm polyp was found in the cecum. The polyp was                            sessile. The polyp was removed with a cold snare.                            Resection and retrieval were complete.                           A 5 mm polyp was found in the ascending colon. The  polyp was sessile. The polyp was removed with a                            cold snare. Resection and retrieval were complete.                           A 9 mm polyp was found in the transverse colon. The                            polyp was sessile. The polyp was removed with a                            cold snare. Resection and retrieval were complete.                           A 3 mm polyp was found in the descending colon. The                            polyp was sessile. The polyp was removed with a                            cold snare. Resection and retrieval were complete.                           Multiple small and large-mouthed diverticula were                            found in the sigmoid colon and descending colon.                           The retroflexed view of the distal rectum and anal                            verge was normal and showed  no anal or rectal                            abnormalities. Complications:            No immediate complications. Estimated Blood Loss:     Estimated blood loss was minimal. Impression:               - One 5 mm polyp in the cecum, removed with a cold                            snare. Resected and retrieved.                           - One 5 mm polyp in the ascending colon, removed                            with a cold snare. Resected and retrieved.                           -  One 9 mm polyp in the transverse colon, removed                            with a cold snare. Resected and retrieved.                           - One 3 mm polyp in the descending colon, removed                            with a cold snare. Resected and retrieved.                           - Moderate diverticulosis in the sigmoid colon and                            in the descending colon.                           - The distal rectum and anal verge are normal on                            retroflexion view. Recommendation:           - Patient has a contact number available for                            emergencies. The signs and symptoms of potential                            delayed complications were discussed with the                            patient. Return to normal activities tomorrow.                            Written discharge instructions were provided to the                            patient.                           - Resume previous diet.                           - Continue present medications.                           - Await pathology results.                           - No repeat colonoscopy due to age at next                            surveillance interval. Jerene Bears, MD 05/17/2022 9:05:19 AM This report has been signed electronically.

## 2022-05-17 NOTE — Progress Notes (Signed)
A and O x3. Report to RN. Tolerated MAC anesthesia well. 

## 2022-05-17 NOTE — Patient Instructions (Addendum)
-   Patient has a contact number available for emergencies. The signs and symptoms of potential delayed complications were discussed with the patient. Return to normal activities tomorrow. Written discharge instructions were provided to the patient. - Resume previous diet. - Continue present medications. - Await pathology results. -No repeat colonoscopy due to age at next surveillance interval.    Handouts on polyps and diverticulosis given.  YOU HAD AN ENDOSCOPIC PROCEDURE TODAY AT [facility]:   Refer to the procedure report that was given to you for any specific questions about what was found during the examination.  If the procedure report does not answer your questions, please call your gastroenterologist to clarify.  If you requested that your care partner not be given the details of your procedure findings, then the procedure report has been included in a sealed envelope for you to review at your convenience later.  YOU SHOULD EXPECT: Some feelings of bloating in the abdomen. Passage of more gas than usual.  Walking can help get rid of the air that was put into your GI tract during the procedure and reduce the bloating. If you had a lower endoscopy (such as a colonoscopy or flexible sigmoidoscopy) you may notice spotting of blood in your stool or on the toilet paper. If you underwent a bowel prep for your procedure, you may not have a normal bowel movement for a few days.  Please Note:  You might notice some irritation and congestion in your nose or some drainage.  This is from the oxygen used during your procedure.  There is no need for concern and it should clear up in a day or so.  SYMPTOMS TO REPORT IMMEDIATELY:  Following lower endoscopy (colonoscopy or flexible sigmoidoscopy):  Excessive amounts of blood in the stool  Significant tenderness or worsening of abdominal pains  Swelling of the abdomen that is new, acute  Fever of 100F or higher  For urgent or emergent  issues, a gastroenterologist can be reached at any hour by calling [phone]. Do not use MyChart messaging for urgent concerns.    DIET:  We do recommend a small meal at first, but then you may proceed to your regular diet.  Drink plenty of fluids but you should avoid alcoholic beverages for 24 hours.  ACTIVITY:  You should plan to take it easy for the rest of today and you should NOT DRIVE or use heavy machinery until tomorrow (because of the sedation medicines used during the test).    FOLLOW UP: Our staff will call the number listed on your records the next business day following your procedure.  We will call around 7:15- 8:00 am to check on you and address any questions or concerns that you may have regarding the information given to you following your procedure. If we do not reach you, we will leave a message.     If any biopsies were taken you will be contacted by phone or by letter within the next 1-3 weeks.  Please call us at [phone] if you have not heard about the biopsies in 3 weeks.    SIGNATURES/CONFIDENTIALITY: You and/or your care partner have signed paperwork which will be entered into your electronic medical record.  These signatures attest to the fact that that the information above on your After Visit Summary has been reviewed and is understood.  Full responsibility of the confidentiality of this discharge information lies with you and/or your care-partner.

## 2022-05-17 NOTE — Progress Notes (Signed)
Called to room to assist during endoscopic procedure.  Patient ID and intended procedure confirmed with present staff. Received instructions for my participation in the procedure from the performing physician.  

## 2022-05-17 NOTE — Progress Notes (Signed)
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  The nature of the procedure, as well as the risks, benefits, and alternatives were carefully and thoroughly reviewed with the patient. Ample time for discussion and questions allowed. The patient understood, was satisfied, and agreed to proceed.     HPI: Cody Mccann is a 81 y.o. male who presents for surveillance colonoscopy.  Medical history as below.  Tolerated the prep.  No recent chest pain or shortness of breath.  No abdominal pain today.  Past Medical History:  Diagnosis Date   Allergy    Cancer ([place])    Basel cell skin cancer   Cataract    Diverticulosis of colon (without mention of hemorrhage) 09/27/2005   Esophageal reflux    Hyperlipidemia    Stricture and stenosis of esophagus    Type II or unspecified type diabetes mellitus without mention of complication, not stated as uncontrolled    Unspecified essential hypertension     Past Surgical History:  Procedure Laterality Date   INGUINAL HERNIA REPAIR Left 1975   LAPAROSCOPIC CHOLECYSTECTOMY  2007   SKIN CANCER EXCISION  2020    Prior to Admission medications   Medication Sig Start Date End Date Taking? Authorizing Provider  cetirizine (ZYRTEC ALLERGY) 10 MG tablet Take 10 mg by mouth daily.   Yes [provider]  gabapentin (NEURONTIN) 300 MG capsule Take by mouth. 03/15/22  Yes [provider]  Insulin Aspart FlexPen (NOVOLOG) 100 UNIT/ML INJECT 4 UNITS UNDER THE SKIN WITH BREAKFAST, 8 UNITS WITH SUPPER AND ADDITIONAL AS DIRECTED. MDD: 39 UNITS DAILY 03/29/22  Yes [provider]  losartan-hydrochlorothiazide (HYZAAR) 100-25 MG per tablet Take 1  tablet by mouth daily.   Yes [provider]  metFORMIN (GLUCOPHAGE) 1000 MG tablet Take 2 tablets by mouth daily. 03/16/22  Yes [provider]  omeprazole (PRILOSEC) 40 MG capsule Take 1 capsule by mouth daily. 09/05/19  Yes [provider]  OVER THE COUNTER MEDICATION Vitamin D 3 5000 IU one capsule daily.   Yes [provider]  pioglitazone (ACTOS) 45 MG tablet Take 45 mg by mouth daily.   Yes [provider]  simvastatin (ZOCOR) 40 MG tablet Take 40 mg by mouth every evening.   Yes [provider]  Accu-Chek FastClix Lancets MISC CHECK BLOOD SUGAR FOUR TIMES DAILY 05/19/20   [provider]  ACCU-CHEK GUIDE test strip  11/10/21   [provider]  B-D UF III MINI PEN NEEDLES 31G X 5 MM MISC SMARTSIG:1 Each SUB-Q 4 Times Daily 04/05/22   [provider]  docusate sodium (COLACE) 100 MG capsule Take 100 mg by mouth daily.    [provider]  Insulin Aspart FlexPen (NOVOLOG) 100 UNIT/ML Inject into the skin. 03/02/22   [provider]  Insulin Glargine (TOUJEO SOLOSTAR [place]) Inject 52 Units into the skin at bedtime.    [provider]  insulin lispro (HUMALOG) 100 UNIT/ML injection Inject into the skin 3 (three) times daily before meals. Sliding scale Patient not taking: Reported on 05/17/2022    [provider]  Insulin Pen Needle (B-D UF III MINI PEN NEEDLES) 31G X 5 MM MISC USE FOUR TIMES DAILY AS NEEDED FOR  INJECTIONS 04/05/22   [provider]  TOUJEO SOLOSTAR 300 UNIT/ML Solostar Pen Inject into the skin. 03/02/22   [provider]    Current Outpatient Medications  Medication Sig Dispense Refill   cetirizine (ZYRTEC ALLERGY) 10 MG tablet Take 10 mg by mouth daily.     gabapentin (NEURONTIN) 300 MG capsule Take by mouth.     Insulin Aspart FlexPen (NOVOLOG) 100 UNIT/ML INJECT 4 UNITS UNDER THE SKIN WITH BREAKFAST, 8 UNITS WITH SUPPER AND ADDITIONAL AS DIRECTED. MDD:  66 UNITS DAILY     losartan-hydrochlorothiazide (HYZAAR) 100-25 MG per tablet Take 1 tablet by mouth daily.     metFORMIN (GLUCOPHAGE) 1000 MG tablet Take 2 tablets by mouth daily.     omeprazole (PRILOSEC) 40 MG capsule Take 1 capsule by mouth daily.     OVER THE COUNTER MEDICATION Vitamin D 3 5000 IU one capsule daily.     pioglitazone (ACTOS) 45 MG tablet Take 45 mg by mouth daily.     simvastatin (ZOCOR) 40 MG tablet Take 40 mg by mouth every evening.     Accu-Chek FastClix Lancets MISC CHECK BLOOD SUGAR FOUR TIMES DAILY     ACCU-CHEK GUIDE test strip      B-D UF III MINI PEN NEEDLES 31G X 5 MM MISC SMARTSIG:1 Each SUB-Q 4 Times Daily     docusate sodium (COLACE) 100 MG capsule Take 100 mg by mouth daily.     Insulin Aspart FlexPen (NOVOLOG) 100 UNIT/ML Inject into the skin.     Insulin Glargine (TOUJEO SOLOSTAR [place]) Inject 52 Units into the skin at bedtime.     insulin lispro (HUMALOG) 100 UNIT/ML injection Inject into the skin 3 (three) times daily before meals. Sliding scale (Patient not taking: Reported on 05/17/2022)     Insulin Pen Needle (B-D UF III MINI PEN NEEDLES) 31G X 5 MM MISC USE FOUR TIMES DAILY AS NEEDED FOR INJECTIONS     TOUJEO SOLOSTAR 300 UNIT/ML Solostar Pen Inject into the skin.     Current Facility-Administered Medications  Medication Dose Route Frequency Provider Last Rate Last Admin   0.9 %  sodium chloride infusion  500 mL Intravenous Continuous [place] Rosamond, Lajuan Lines, MD        Allergies as of 05/17/2022   (No Known Allergies)    Family History  Problem Relation Age of Onset   Diabetes Other        Grandparents   Colon cancer Neg Hx    Esophageal cancer Neg Hx    Rectal cancer Neg Hx    Stomach cancer Neg Hx     Social History   Socioeconomic History   Marital status: Married    Spouse name: Not on file   Number of children: 2   Years of education: Not on file   Highest education level: Not on file  Occupational History   Occupation: Retired     Comment: [employer]  Tobacco Use   Smoking status: Never   Smokeless tobacco: Never  Vaping Use   Vaping Use: Never used  Substance and Sexual Activity   Alcohol use: No    Alcohol/week: 0.0 standard drinks of alcohol   Drug use: No   Sexual activity: Not on file  Other Topics Concern   Not on file  Social History Narrative   Not on file   Social Determinants of Health   Financial Resource Strain: Not on file  Food Insecurity: Not on file  Transportation Needs: Not on  file  Physical Activity: Not on file  Stress: Not on file  Social Connections: Not on file  Intimate Partner Violence: Not on file    Physical Exam: Vital signs in last 24 hours: '@BP'$  108/81   Pulse 60   Temp 98.6 F (37 C)   Ht '5\' 7"'$  (1.702 m)   Wt 211 lb (95.7 kg)   SpO2 98%   BMI 33.05 kg/m  GEN: NAD EYE: Sclerae anicteric ENT: MMM CV: Non-tachycardic Pulm: CTA b/l GI: Soft, NT/ND NEURO:  Alert & Oriented x 3   Zenovia Jarred, MD [facility]  05/17/2022 8:28 AM

## 2022-05-18 ENCOUNTER — Telehealth: Payer: Self-pay

## 2022-05-18 NOTE — Telephone Encounter (Signed)
  Follow up Call-     05/17/2022    7:46 AM  Call back number  Post procedure Call Back phone  # [phone]  Permission to leave phone message Yes     Patient questions:  Do you have a fever, pain , or abdominal swelling? No. Pain Score  0 *  Have you tolerated food without any problems? Yes.    Have you been able to return to your normal activities? Yes.    Do you have any questions about your discharge instructions: Diet   No. Medications  No. Follow up visit  No.  Do you have questions or concerns about your Care? No.  Actions: * If pain score is 4 or above: No action needed, pain <4.

## 2022-05-24 ENCOUNTER — Encounter: Payer: Self-pay | Admitting: Internal Medicine

## 2022-05-25 DIAGNOSIS — C44712 Basal cell carcinoma of skin of right lower limb, including hip: Secondary | ICD-10-CM | POA: Diagnosis not present

## 2022-05-25 DIAGNOSIS — E119 Type 2 diabetes mellitus without complications: Secondary | ICD-10-CM | POA: Diagnosis not present

## 2022-05-26 DIAGNOSIS — Z794 Long term (current) use of insulin: Secondary | ICD-10-CM | POA: Diagnosis not present

## 2022-05-26 DIAGNOSIS — E1165 Type 2 diabetes mellitus with hyperglycemia: Secondary | ICD-10-CM | POA: Diagnosis not present

## 2022-06-07 DIAGNOSIS — E119 Type 2 diabetes mellitus without complications: Secondary | ICD-10-CM | POA: Diagnosis not present

## 2022-06-07 DIAGNOSIS — E1165 Type 2 diabetes mellitus with hyperglycemia: Secondary | ICD-10-CM | POA: Diagnosis not present

## 2022-06-18 DIAGNOSIS — Z794 Long term (current) use of insulin: Secondary | ICD-10-CM | POA: Diagnosis not present

## 2022-06-18 DIAGNOSIS — E1165 Type 2 diabetes mellitus with hyperglycemia: Secondary | ICD-10-CM | POA: Diagnosis not present

## 2022-06-21 DIAGNOSIS — E1169 Type 2 diabetes mellitus with other specified complication: Secondary | ICD-10-CM | POA: Diagnosis not present

## 2022-07-05 DIAGNOSIS — D638 Anemia in other chronic diseases classified elsewhere: Secondary | ICD-10-CM | POA: Diagnosis not present

## 2022-07-05 DIAGNOSIS — I672 Cerebral atherosclerosis: Secondary | ICD-10-CM | POA: Diagnosis not present

## 2022-07-05 DIAGNOSIS — E669 Obesity, unspecified: Secondary | ICD-10-CM | POA: Diagnosis not present

## 2022-07-05 DIAGNOSIS — M8580 Other specified disorders of bone density and structure, unspecified site: Secondary | ICD-10-CM | POA: Diagnosis not present

## 2022-07-05 DIAGNOSIS — E785 Hyperlipidemia, unspecified: Secondary | ICD-10-CM | POA: Diagnosis not present

## 2022-07-05 DIAGNOSIS — K2960 Other gastritis without bleeding: Secondary | ICD-10-CM | POA: Diagnosis not present

## 2022-07-05 DIAGNOSIS — I10 Essential (primary) hypertension: Secondary | ICD-10-CM | POA: Diagnosis not present

## 2022-07-05 DIAGNOSIS — E1169 Type 2 diabetes mellitus with other specified complication: Secondary | ICD-10-CM | POA: Diagnosis not present

## 2022-07-05 DIAGNOSIS — F411 Generalized anxiety disorder: Secondary | ICD-10-CM | POA: Diagnosis not present

## 2022-07-29 DIAGNOSIS — N529 Male erectile dysfunction, unspecified: Secondary | ICD-10-CM | POA: Diagnosis not present

## 2022-07-29 DIAGNOSIS — N401 Enlarged prostate with lower urinary tract symptoms: Secondary | ICD-10-CM | POA: Diagnosis not present

## 2022-08-30 DIAGNOSIS — E1169 Type 2 diabetes mellitus with other specified complication: Secondary | ICD-10-CM | POA: Diagnosis not present

## 2022-08-30 DIAGNOSIS — Z6831 Body mass index (BMI) 31.0-31.9, adult: Secondary | ICD-10-CM | POA: Diagnosis not present

## 2022-08-30 DIAGNOSIS — E785 Hyperlipidemia, unspecified: Secondary | ICD-10-CM | POA: Diagnosis not present

## 2022-08-30 DIAGNOSIS — J329 Chronic sinusitis, unspecified: Secondary | ICD-10-CM | POA: Diagnosis not present

## 2022-09-05 DIAGNOSIS — E1165 Type 2 diabetes mellitus with hyperglycemia: Secondary | ICD-10-CM | POA: Diagnosis not present

## 2022-09-05 DIAGNOSIS — E119 Type 2 diabetes mellitus without complications: Secondary | ICD-10-CM | POA: Diagnosis not present

## 2022-09-14 DIAGNOSIS — Z7984 Long term (current) use of oral hypoglycemic drugs: Secondary | ICD-10-CM | POA: Diagnosis not present

## 2022-09-14 DIAGNOSIS — I10 Essential (primary) hypertension: Secondary | ICD-10-CM | POA: Diagnosis not present

## 2022-09-14 DIAGNOSIS — E785 Hyperlipidemia, unspecified: Secondary | ICD-10-CM | POA: Diagnosis not present

## 2022-09-14 DIAGNOSIS — E1165 Type 2 diabetes mellitus with hyperglycemia: Secondary | ICD-10-CM | POA: Diagnosis not present

## 2022-09-14 DIAGNOSIS — Z794 Long term (current) use of insulin: Secondary | ICD-10-CM | POA: Diagnosis not present

## 2022-09-21 DIAGNOSIS — L570 Actinic keratosis: Secondary | ICD-10-CM | POA: Diagnosis not present

## 2022-09-21 DIAGNOSIS — C44712 Basal cell carcinoma of skin of right lower limb, including hip: Secondary | ICD-10-CM | POA: Diagnosis not present

## 2022-10-25 DIAGNOSIS — I4510 Unspecified right bundle-branch block: Secondary | ICD-10-CM | POA: Diagnosis not present

## 2022-11-08 DIAGNOSIS — I4519 Other right bundle-branch block: Secondary | ICD-10-CM | POA: Diagnosis not present

## 2022-11-25 DIAGNOSIS — I361 Nonrheumatic tricuspid (valve) insufficiency: Secondary | ICD-10-CM | POA: Diagnosis not present

## 2022-11-25 DIAGNOSIS — I214 Non-ST elevation (NSTEMI) myocardial infarction: Secondary | ICD-10-CM | POA: Diagnosis not present

## 2022-11-25 DIAGNOSIS — R079 Chest pain, unspecified: Secondary | ICD-10-CM | POA: Diagnosis not present

## 2022-11-25 DIAGNOSIS — I493 Ventricular premature depolarization: Secondary | ICD-10-CM | POA: Diagnosis not present

## 2022-11-25 DIAGNOSIS — E785 Hyperlipidemia, unspecified: Secondary | ICD-10-CM | POA: Diagnosis not present

## 2022-11-25 DIAGNOSIS — J810 Acute pulmonary edema: Secondary | ICD-10-CM | POA: Diagnosis not present

## 2022-11-25 DIAGNOSIS — I517 Cardiomegaly: Secondary | ICD-10-CM | POA: Diagnosis not present

## 2022-11-25 DIAGNOSIS — I5020 Unspecified systolic (congestive) heart failure: Secondary | ICD-10-CM | POA: Diagnosis not present

## 2022-11-25 DIAGNOSIS — I4510 Unspecified right bundle-branch block: Secondary | ICD-10-CM | POA: Diagnosis not present

## 2022-11-25 DIAGNOSIS — R7989 Other specified abnormal findings of blood chemistry: Secondary | ICD-10-CM | POA: Diagnosis not present

## 2022-11-25 DIAGNOSIS — M1990 Unspecified osteoarthritis, unspecified site: Secondary | ICD-10-CM | POA: Diagnosis not present

## 2022-11-25 DIAGNOSIS — I495 Sick sinus syndrome: Secondary | ICD-10-CM | POA: Diagnosis not present

## 2022-11-25 DIAGNOSIS — Z6830 Body mass index (BMI) 30.0-30.9, adult: Secondary | ICD-10-CM | POA: Diagnosis not present

## 2022-11-25 DIAGNOSIS — I080 Rheumatic disorders of both mitral and aortic valves: Secondary | ICD-10-CM | POA: Diagnosis not present

## 2022-11-25 DIAGNOSIS — E1165 Type 2 diabetes mellitus with hyperglycemia: Secondary | ICD-10-CM | POA: Diagnosis not present

## 2022-11-25 DIAGNOSIS — N179 Acute kidney failure, unspecified: Secondary | ICD-10-CM | POA: Diagnosis not present

## 2022-11-25 DIAGNOSIS — N400 Enlarged prostate without lower urinary tract symptoms: Secondary | ICD-10-CM | POA: Diagnosis not present

## 2022-11-25 DIAGNOSIS — E7800 Pure hypercholesterolemia, unspecified: Secondary | ICD-10-CM | POA: Diagnosis not present

## 2022-11-25 DIAGNOSIS — I472 Ventricular tachycardia, unspecified: Secondary | ICD-10-CM | POA: Diagnosis not present

## 2022-11-25 DIAGNOSIS — E119 Type 2 diabetes mellitus without complications: Secondary | ICD-10-CM | POA: Diagnosis not present

## 2022-11-25 DIAGNOSIS — I2510 Atherosclerotic heart disease of native coronary artery without angina pectoris: Secondary | ICD-10-CM | POA: Diagnosis not present

## 2022-11-25 DIAGNOSIS — I249 Acute ischemic heart disease, unspecified: Secondary | ICD-10-CM | POA: Diagnosis not present

## 2022-11-25 DIAGNOSIS — I10 Essential (primary) hypertension: Secondary | ICD-10-CM | POA: Diagnosis not present

## 2022-11-25 DIAGNOSIS — I455 Other specified heart block: Secondary | ICD-10-CM | POA: Diagnosis not present

## 2022-11-25 DIAGNOSIS — I4891 Unspecified atrial fibrillation: Secondary | ICD-10-CM | POA: Diagnosis not present

## 2022-11-25 DIAGNOSIS — D62 Acute posthemorrhagic anemia: Secondary | ICD-10-CM | POA: Diagnosis not present

## 2022-11-26 DIAGNOSIS — E785 Hyperlipidemia, unspecified: Secondary | ICD-10-CM | POA: Diagnosis not present

## 2022-11-26 DIAGNOSIS — I455 Other specified heart block: Secondary | ICD-10-CM | POA: Diagnosis not present

## 2022-11-26 DIAGNOSIS — R9439 Abnormal result of other cardiovascular function study: Secondary | ICD-10-CM | POA: Diagnosis not present

## 2022-11-26 DIAGNOSIS — R0689 Other abnormalities of breathing: Secondary | ICD-10-CM | POA: Diagnosis not present

## 2022-11-26 DIAGNOSIS — N179 Acute kidney failure, unspecified: Secondary | ICD-10-CM | POA: Diagnosis not present

## 2022-11-26 DIAGNOSIS — R001 Bradycardia, unspecified: Secondary | ICD-10-CM | POA: Diagnosis not present

## 2022-11-26 DIAGNOSIS — J9811 Atelectasis: Secondary | ICD-10-CM | POA: Diagnosis not present

## 2022-11-26 DIAGNOSIS — I499 Cardiac arrhythmia, unspecified: Secondary | ICD-10-CM | POA: Diagnosis not present

## 2022-11-26 DIAGNOSIS — Z4659 Encounter for fitting and adjustment of other gastrointestinal appliance and device: Secondary | ICD-10-CM | POA: Diagnosis not present

## 2022-11-26 DIAGNOSIS — R9389 Abnormal findings on diagnostic imaging of other specified body structures: Secondary | ICD-10-CM | POA: Diagnosis not present

## 2022-11-26 DIAGNOSIS — R0609 Other forms of dyspnea: Secondary | ICD-10-CM | POA: Diagnosis not present

## 2022-11-26 DIAGNOSIS — I10 Essential (primary) hypertension: Secondary | ICD-10-CM | POA: Diagnosis not present

## 2022-11-26 DIAGNOSIS — I214 Non-ST elevation (NSTEMI) myocardial infarction: Secondary | ICD-10-CM | POA: Diagnosis not present

## 2022-11-26 DIAGNOSIS — I25118 Atherosclerotic heart disease of native coronary artery with other forms of angina pectoris: Secondary | ICD-10-CM | POA: Diagnosis not present

## 2022-11-26 DIAGNOSIS — I249 Acute ischemic heart disease, unspecified: Secondary | ICD-10-CM

## 2022-11-26 DIAGNOSIS — D62 Acute posthemorrhagic anemia: Secondary | ICD-10-CM | POA: Diagnosis not present

## 2022-11-26 DIAGNOSIS — D72829 Elevated white blood cell count, unspecified: Secondary | ICD-10-CM | POA: Diagnosis not present

## 2022-11-26 DIAGNOSIS — Z452 Encounter for adjustment and management of vascular access device: Secondary | ICD-10-CM | POA: Diagnosis not present

## 2022-11-26 DIAGNOSIS — I2510 Atherosclerotic heart disease of native coronary artery without angina pectoris: Secondary | ICD-10-CM | POA: Diagnosis not present

## 2022-11-26 DIAGNOSIS — E669 Obesity, unspecified: Secondary | ICD-10-CM | POA: Diagnosis not present

## 2022-11-26 DIAGNOSIS — E1165 Type 2 diabetes mellitus with hyperglycemia: Secondary | ICD-10-CM | POA: Diagnosis not present

## 2022-11-26 DIAGNOSIS — I4891 Unspecified atrial fibrillation: Secondary | ICD-10-CM | POA: Diagnosis not present

## 2022-11-26 DIAGNOSIS — Z951 Presence of aortocoronary bypass graft: Secondary | ICD-10-CM | POA: Diagnosis not present

## 2022-11-26 DIAGNOSIS — J90 Pleural effusion, not elsewhere classified: Secondary | ICD-10-CM | POA: Diagnosis not present

## 2022-11-26 DIAGNOSIS — I480 Paroxysmal atrial fibrillation: Secondary | ICD-10-CM | POA: Diagnosis not present

## 2022-11-26 DIAGNOSIS — I6523 Occlusion and stenosis of bilateral carotid arteries: Secondary | ICD-10-CM | POA: Diagnosis not present

## 2022-11-26 DIAGNOSIS — I4510 Unspecified right bundle-branch block: Secondary | ICD-10-CM | POA: Diagnosis not present

## 2022-11-26 DIAGNOSIS — Z01810 Encounter for preprocedural cardiovascular examination: Secondary | ICD-10-CM | POA: Diagnosis not present

## 2022-11-26 DIAGNOSIS — N529 Male erectile dysfunction, unspecified: Secondary | ICD-10-CM | POA: Diagnosis not present

## 2022-11-26 DIAGNOSIS — N401 Enlarged prostate with lower urinary tract symptoms: Secondary | ICD-10-CM | POA: Diagnosis not present

## 2022-11-26 DIAGNOSIS — I080 Rheumatic disorders of both mitral and aortic valves: Secondary | ICD-10-CM | POA: Diagnosis not present

## 2022-11-26 DIAGNOSIS — G8918 Other acute postprocedural pain: Secondary | ICD-10-CM | POA: Diagnosis not present

## 2022-11-26 DIAGNOSIS — I361 Nonrheumatic tricuspid (valve) insufficiency: Secondary | ICD-10-CM | POA: Diagnosis not present

## 2022-11-26 DIAGNOSIS — R059 Cough, unspecified: Secondary | ICD-10-CM | POA: Diagnosis not present

## 2022-11-26 DIAGNOSIS — I255 Ischemic cardiomyopathy: Secondary | ICD-10-CM | POA: Diagnosis not present

## 2022-11-26 DIAGNOSIS — K219 Gastro-esophageal reflux disease without esophagitis: Secondary | ICD-10-CM | POA: Diagnosis not present

## 2022-11-26 DIAGNOSIS — I495 Sick sinus syndrome: Secondary | ICD-10-CM | POA: Diagnosis not present

## 2022-11-26 DIAGNOSIS — E119 Type 2 diabetes mellitus without complications: Secondary | ICD-10-CM | POA: Diagnosis not present

## 2022-11-26 DIAGNOSIS — I472 Ventricular tachycardia, unspecified: Secondary | ICD-10-CM | POA: Diagnosis not present

## 2022-11-26 DIAGNOSIS — Z6830 Body mass index (BMI) 30.0-30.9, adult: Secondary | ICD-10-CM | POA: Diagnosis not present

## 2022-11-26 DIAGNOSIS — Z95811 Presence of heart assist device: Secondary | ICD-10-CM | POA: Diagnosis not present

## 2022-11-26 DIAGNOSIS — R918 Other nonspecific abnormal finding of lung field: Secondary | ICD-10-CM | POA: Diagnosis not present

## 2022-11-26 DIAGNOSIS — R079 Chest pain, unspecified: Secondary | ICD-10-CM | POA: Diagnosis not present

## 2022-11-26 DIAGNOSIS — I493 Ventricular premature depolarization: Secondary | ICD-10-CM | POA: Diagnosis not present

## 2022-12-04 DIAGNOSIS — E1165 Type 2 diabetes mellitus with hyperglycemia: Secondary | ICD-10-CM | POA: Diagnosis not present

## 2022-12-04 DIAGNOSIS — E119 Type 2 diabetes mellitus without complications: Secondary | ICD-10-CM | POA: Diagnosis not present

## 2022-12-13 DIAGNOSIS — I10 Essential (primary) hypertension: Secondary | ICD-10-CM | POA: Diagnosis not present

## 2022-12-13 DIAGNOSIS — Z950 Presence of cardiac pacemaker: Secondary | ICD-10-CM | POA: Diagnosis not present

## 2022-12-13 DIAGNOSIS — I4891 Unspecified atrial fibrillation: Secondary | ICD-10-CM | POA: Diagnosis not present

## 2022-12-13 DIAGNOSIS — D6869 Other thrombophilia: Secondary | ICD-10-CM | POA: Diagnosis not present

## 2022-12-13 DIAGNOSIS — E785 Hyperlipidemia, unspecified: Secondary | ICD-10-CM | POA: Diagnosis not present

## 2022-12-13 DIAGNOSIS — I6523 Occlusion and stenosis of bilateral carotid arteries: Secondary | ICD-10-CM | POA: Diagnosis not present

## 2022-12-13 DIAGNOSIS — I25119 Atherosclerotic heart disease of native coronary artery with unspecified angina pectoris: Secondary | ICD-10-CM | POA: Diagnosis not present

## 2022-12-13 DIAGNOSIS — E1169 Type 2 diabetes mellitus with other specified complication: Secondary | ICD-10-CM | POA: Diagnosis not present

## 2022-12-13 DIAGNOSIS — R609 Edema, unspecified: Secondary | ICD-10-CM | POA: Diagnosis not present

## 2022-12-20 DIAGNOSIS — E785 Hyperlipidemia, unspecified: Secondary | ICD-10-CM | POA: Diagnosis not present

## 2022-12-20 DIAGNOSIS — M8580 Other specified disorders of bone density and structure, unspecified site: Secondary | ICD-10-CM | POA: Diagnosis not present

## 2022-12-20 DIAGNOSIS — M8589 Other specified disorders of bone density and structure, multiple sites: Secondary | ICD-10-CM | POA: Diagnosis not present

## 2022-12-20 DIAGNOSIS — Z125 Encounter for screening for malignant neoplasm of prostate: Secondary | ICD-10-CM | POA: Diagnosis not present

## 2022-12-20 DIAGNOSIS — D638 Anemia in other chronic diseases classified elsewhere: Secondary | ICD-10-CM | POA: Diagnosis not present

## 2022-12-20 DIAGNOSIS — E1169 Type 2 diabetes mellitus with other specified complication: Secondary | ICD-10-CM | POA: Diagnosis not present

## 2022-12-20 DIAGNOSIS — Z79899 Other long term (current) drug therapy: Secondary | ICD-10-CM | POA: Diagnosis not present

## 2023-01-04 DIAGNOSIS — I6523 Occlusion and stenosis of bilateral carotid arteries: Secondary | ICD-10-CM | POA: Diagnosis not present

## 2023-01-04 DIAGNOSIS — E1169 Type 2 diabetes mellitus with other specified complication: Secondary | ICD-10-CM | POA: Diagnosis not present

## 2023-01-04 DIAGNOSIS — I4891 Unspecified atrial fibrillation: Secondary | ICD-10-CM | POA: Diagnosis not present

## 2023-01-04 DIAGNOSIS — Z0000 Encounter for general adult medical examination without abnormal findings: Secondary | ICD-10-CM | POA: Diagnosis not present

## 2023-01-04 DIAGNOSIS — D6869 Other thrombophilia: Secondary | ICD-10-CM | POA: Diagnosis not present

## 2023-01-04 DIAGNOSIS — I10 Essential (primary) hypertension: Secondary | ICD-10-CM | POA: Diagnosis not present

## 2023-01-04 DIAGNOSIS — I25119 Atherosclerotic heart disease of native coronary artery with unspecified angina pectoris: Secondary | ICD-10-CM | POA: Diagnosis not present

## 2023-01-04 DIAGNOSIS — Z950 Presence of cardiac pacemaker: Secondary | ICD-10-CM | POA: Diagnosis not present

## 2023-01-04 DIAGNOSIS — E785 Hyperlipidemia, unspecified: Secondary | ICD-10-CM | POA: Diagnosis not present

## 2023-01-05 DIAGNOSIS — E119 Type 2 diabetes mellitus without complications: Secondary | ICD-10-CM | POA: Diagnosis not present

## 2023-01-05 DIAGNOSIS — I10 Essential (primary) hypertension: Secondary | ICD-10-CM | POA: Diagnosis not present

## 2023-01-05 DIAGNOSIS — E785 Hyperlipidemia, unspecified: Secondary | ICD-10-CM | POA: Diagnosis not present

## 2023-01-05 DIAGNOSIS — I252 Old myocardial infarction: Secondary | ICD-10-CM | POA: Diagnosis not present

## 2023-01-05 DIAGNOSIS — I480 Paroxysmal atrial fibrillation: Secondary | ICD-10-CM | POA: Diagnosis not present

## 2023-01-05 DIAGNOSIS — Z951 Presence of aortocoronary bypass graft: Secondary | ICD-10-CM | POA: Diagnosis not present

## 2023-01-05 DIAGNOSIS — Z955 Presence of coronary angioplasty implant and graft: Secondary | ICD-10-CM | POA: Diagnosis not present

## 2023-01-05 DIAGNOSIS — Z950 Presence of cardiac pacemaker: Secondary | ICD-10-CM | POA: Diagnosis not present

## 2023-01-14 DIAGNOSIS — Z951 Presence of aortocoronary bypass graft: Secondary | ICD-10-CM | POA: Diagnosis not present

## 2023-01-14 DIAGNOSIS — Z955 Presence of coronary angioplasty implant and graft: Secondary | ICD-10-CM | POA: Diagnosis not present

## 2023-01-17 DIAGNOSIS — Z7984 Long term (current) use of oral hypoglycemic drugs: Secondary | ICD-10-CM | POA: Diagnosis not present

## 2023-01-17 DIAGNOSIS — E118 Type 2 diabetes mellitus with unspecified complications: Secondary | ICD-10-CM | POA: Diagnosis not present

## 2023-01-17 DIAGNOSIS — Z794 Long term (current) use of insulin: Secondary | ICD-10-CM | POA: Diagnosis not present

## 2023-01-19 DIAGNOSIS — Z955 Presence of coronary angioplasty implant and graft: Secondary | ICD-10-CM | POA: Diagnosis not present

## 2023-01-19 DIAGNOSIS — Z951 Presence of aortocoronary bypass graft: Secondary | ICD-10-CM | POA: Diagnosis not present

## 2023-01-21 DIAGNOSIS — Z955 Presence of coronary angioplasty implant and graft: Secondary | ICD-10-CM | POA: Diagnosis not present

## 2023-01-21 DIAGNOSIS — Z951 Presence of aortocoronary bypass graft: Secondary | ICD-10-CM | POA: Diagnosis not present

## 2023-01-24 DIAGNOSIS — Z951 Presence of aortocoronary bypass graft: Secondary | ICD-10-CM | POA: Diagnosis not present

## 2023-01-24 DIAGNOSIS — Z955 Presence of coronary angioplasty implant and graft: Secondary | ICD-10-CM | POA: Diagnosis not present

## 2023-01-26 DIAGNOSIS — Z955 Presence of coronary angioplasty implant and graft: Secondary | ICD-10-CM | POA: Diagnosis not present

## 2023-01-26 DIAGNOSIS — Z951 Presence of aortocoronary bypass graft: Secondary | ICD-10-CM | POA: Diagnosis not present

## 2023-01-28 DIAGNOSIS — Z955 Presence of coronary angioplasty implant and graft: Secondary | ICD-10-CM | POA: Diagnosis not present

## 2023-01-28 DIAGNOSIS — Z951 Presence of aortocoronary bypass graft: Secondary | ICD-10-CM | POA: Diagnosis not present

## 2023-01-31 DIAGNOSIS — Z951 Presence of aortocoronary bypass graft: Secondary | ICD-10-CM | POA: Diagnosis not present

## 2023-01-31 DIAGNOSIS — Z955 Presence of coronary angioplasty implant and graft: Secondary | ICD-10-CM | POA: Diagnosis not present

## 2023-02-02 DIAGNOSIS — Z951 Presence of aortocoronary bypass graft: Secondary | ICD-10-CM | POA: Diagnosis not present

## 2023-02-02 DIAGNOSIS — Z955 Presence of coronary angioplasty implant and graft: Secondary | ICD-10-CM | POA: Diagnosis not present

## 2023-02-04 DIAGNOSIS — Z955 Presence of coronary angioplasty implant and graft: Secondary | ICD-10-CM | POA: Diagnosis not present

## 2023-02-04 DIAGNOSIS — Z951 Presence of aortocoronary bypass graft: Secondary | ICD-10-CM | POA: Diagnosis not present

## 2023-02-07 DIAGNOSIS — Z955 Presence of coronary angioplasty implant and graft: Secondary | ICD-10-CM | POA: Diagnosis not present

## 2023-02-07 DIAGNOSIS — Z951 Presence of aortocoronary bypass graft: Secondary | ICD-10-CM | POA: Diagnosis not present

## 2023-02-09 DIAGNOSIS — Z955 Presence of coronary angioplasty implant and graft: Secondary | ICD-10-CM | POA: Diagnosis not present

## 2023-02-09 DIAGNOSIS — Z951 Presence of aortocoronary bypass graft: Secondary | ICD-10-CM | POA: Diagnosis not present

## 2023-02-11 DIAGNOSIS — Z955 Presence of coronary angioplasty implant and graft: Secondary | ICD-10-CM | POA: Diagnosis not present

## 2023-02-11 DIAGNOSIS — Z951 Presence of aortocoronary bypass graft: Secondary | ICD-10-CM | POA: Diagnosis not present

## 2023-02-14 DIAGNOSIS — Z951 Presence of aortocoronary bypass graft: Secondary | ICD-10-CM | POA: Diagnosis not present

## 2023-02-14 DIAGNOSIS — Z955 Presence of coronary angioplasty implant and graft: Secondary | ICD-10-CM | POA: Diagnosis not present

## 2023-02-16 DIAGNOSIS — Z951 Presence of aortocoronary bypass graft: Secondary | ICD-10-CM | POA: Diagnosis not present

## 2023-02-16 DIAGNOSIS — Z955 Presence of coronary angioplasty implant and graft: Secondary | ICD-10-CM | POA: Diagnosis not present

## 2023-02-18 DIAGNOSIS — Z951 Presence of aortocoronary bypass graft: Secondary | ICD-10-CM | POA: Diagnosis not present

## 2023-02-18 DIAGNOSIS — Z955 Presence of coronary angioplasty implant and graft: Secondary | ICD-10-CM | POA: Diagnosis not present

## 2023-02-21 DIAGNOSIS — Z955 Presence of coronary angioplasty implant and graft: Secondary | ICD-10-CM | POA: Diagnosis not present

## 2023-02-21 DIAGNOSIS — Z951 Presence of aortocoronary bypass graft: Secondary | ICD-10-CM | POA: Diagnosis not present

## 2023-02-23 DIAGNOSIS — Z955 Presence of coronary angioplasty implant and graft: Secondary | ICD-10-CM | POA: Diagnosis not present

## 2023-02-23 DIAGNOSIS — Z951 Presence of aortocoronary bypass graft: Secondary | ICD-10-CM | POA: Diagnosis not present

## 2023-02-25 DIAGNOSIS — Z955 Presence of coronary angioplasty implant and graft: Secondary | ICD-10-CM | POA: Diagnosis not present

## 2023-02-25 DIAGNOSIS — Z951 Presence of aortocoronary bypass graft: Secondary | ICD-10-CM | POA: Diagnosis not present

## 2023-02-28 DIAGNOSIS — H52223 Regular astigmatism, bilateral: Secondary | ICD-10-CM | POA: Diagnosis not present

## 2023-02-28 DIAGNOSIS — H353111 Nonexudative age-related macular degeneration, right eye, early dry stage: Secondary | ICD-10-CM | POA: Diagnosis not present

## 2023-02-28 DIAGNOSIS — H524 Presbyopia: Secondary | ICD-10-CM | POA: Diagnosis not present

## 2023-02-28 DIAGNOSIS — Z951 Presence of aortocoronary bypass graft: Secondary | ICD-10-CM | POA: Diagnosis not present

## 2023-02-28 DIAGNOSIS — H3530 Unspecified macular degeneration: Secondary | ICD-10-CM | POA: Diagnosis not present

## 2023-02-28 DIAGNOSIS — Z961 Presence of intraocular lens: Secondary | ICD-10-CM | POA: Diagnosis not present

## 2023-02-28 DIAGNOSIS — E119 Type 2 diabetes mellitus without complications: Secondary | ICD-10-CM | POA: Diagnosis not present

## 2023-02-28 DIAGNOSIS — H5231 Anisometropia: Secondary | ICD-10-CM | POA: Diagnosis not present

## 2023-02-28 DIAGNOSIS — Z794 Long term (current) use of insulin: Secondary | ICD-10-CM | POA: Diagnosis not present

## 2023-02-28 DIAGNOSIS — Z7984 Long term (current) use of oral hypoglycemic drugs: Secondary | ICD-10-CM | POA: Diagnosis not present

## 2023-02-28 DIAGNOSIS — Z955 Presence of coronary angioplasty implant and graft: Secondary | ICD-10-CM | POA: Diagnosis not present

## 2023-03-02 DIAGNOSIS — Z951 Presence of aortocoronary bypass graft: Secondary | ICD-10-CM | POA: Diagnosis not present

## 2023-03-02 DIAGNOSIS — Z955 Presence of coronary angioplasty implant and graft: Secondary | ICD-10-CM | POA: Diagnosis not present

## 2023-03-03 DIAGNOSIS — Z9889 Other specified postprocedural states: Secondary | ICD-10-CM | POA: Diagnosis not present

## 2023-03-03 DIAGNOSIS — Z45018 Encounter for adjustment and management of other part of cardiac pacemaker: Secondary | ICD-10-CM | POA: Diagnosis not present

## 2023-03-03 DIAGNOSIS — I252 Old myocardial infarction: Secondary | ICD-10-CM | POA: Diagnosis not present

## 2023-03-03 DIAGNOSIS — I10 Essential (primary) hypertension: Secondary | ICD-10-CM | POA: Diagnosis not present

## 2023-03-03 DIAGNOSIS — I495 Sick sinus syndrome: Secondary | ICD-10-CM | POA: Diagnosis not present

## 2023-03-03 DIAGNOSIS — I209 Angina pectoris, unspecified: Secondary | ICD-10-CM | POA: Diagnosis not present

## 2023-03-03 DIAGNOSIS — D509 Iron deficiency anemia, unspecified: Secondary | ICD-10-CM | POA: Diagnosis not present

## 2023-03-03 DIAGNOSIS — Z951 Presence of aortocoronary bypass graft: Secondary | ICD-10-CM | POA: Diagnosis not present

## 2023-03-04 DIAGNOSIS — E1165 Type 2 diabetes mellitus with hyperglycemia: Secondary | ICD-10-CM | POA: Diagnosis not present

## 2023-03-04 DIAGNOSIS — Z951 Presence of aortocoronary bypass graft: Secondary | ICD-10-CM | POA: Diagnosis not present

## 2023-03-04 DIAGNOSIS — E119 Type 2 diabetes mellitus without complications: Secondary | ICD-10-CM | POA: Diagnosis not present

## 2023-03-04 DIAGNOSIS — Z955 Presence of coronary angioplasty implant and graft: Secondary | ICD-10-CM | POA: Diagnosis not present

## 2023-03-10 DIAGNOSIS — I495 Sick sinus syndrome: Secondary | ICD-10-CM | POA: Diagnosis not present

## 2023-03-11 DIAGNOSIS — I495 Sick sinus syndrome: Secondary | ICD-10-CM | POA: Diagnosis not present

## 2023-03-11 DIAGNOSIS — I4729 Other ventricular tachycardia: Secondary | ICD-10-CM | POA: Diagnosis not present

## 2023-03-11 DIAGNOSIS — Z45018 Encounter for adjustment and management of other part of cardiac pacemaker: Secondary | ICD-10-CM | POA: Diagnosis not present

## 2023-03-14 DIAGNOSIS — Z951 Presence of aortocoronary bypass graft: Secondary | ICD-10-CM | POA: Diagnosis not present

## 2023-03-16 DIAGNOSIS — Z951 Presence of aortocoronary bypass graft: Secondary | ICD-10-CM | POA: Diagnosis not present

## 2023-03-18 DIAGNOSIS — Z951 Presence of aortocoronary bypass graft: Secondary | ICD-10-CM | POA: Diagnosis not present

## 2023-03-21 DIAGNOSIS — Z951 Presence of aortocoronary bypass graft: Secondary | ICD-10-CM | POA: Diagnosis not present

## 2023-03-22 DIAGNOSIS — L821 Other seborrheic keratosis: Secondary | ICD-10-CM | POA: Diagnosis not present

## 2023-03-22 DIAGNOSIS — L570 Actinic keratosis: Secondary | ICD-10-CM | POA: Diagnosis not present

## 2023-03-22 DIAGNOSIS — C44729 Squamous cell carcinoma of skin of left lower limb, including hip: Secondary | ICD-10-CM | POA: Diagnosis not present

## 2023-03-23 DIAGNOSIS — Z951 Presence of aortocoronary bypass graft: Secondary | ICD-10-CM | POA: Diagnosis not present

## 2023-03-25 DIAGNOSIS — Z951 Presence of aortocoronary bypass graft: Secondary | ICD-10-CM | POA: Diagnosis not present

## 2023-03-28 DIAGNOSIS — Z951 Presence of aortocoronary bypass graft: Secondary | ICD-10-CM | POA: Diagnosis not present

## 2023-03-30 DIAGNOSIS — Z951 Presence of aortocoronary bypass graft: Secondary | ICD-10-CM | POA: Diagnosis not present

## 2023-04-01 DIAGNOSIS — Z951 Presence of aortocoronary bypass graft: Secondary | ICD-10-CM | POA: Diagnosis not present

## 2023-04-04 DIAGNOSIS — Z951 Presence of aortocoronary bypass graft: Secondary | ICD-10-CM | POA: Diagnosis not present

## 2023-04-06 DIAGNOSIS — Z951 Presence of aortocoronary bypass graft: Secondary | ICD-10-CM | POA: Diagnosis not present

## 2023-04-08 DIAGNOSIS — Z951 Presence of aortocoronary bypass graft: Secondary | ICD-10-CM | POA: Diagnosis not present

## 2023-04-12 DIAGNOSIS — Z951 Presence of aortocoronary bypass graft: Secondary | ICD-10-CM | POA: Diagnosis not present

## 2023-04-13 DIAGNOSIS — Z951 Presence of aortocoronary bypass graft: Secondary | ICD-10-CM | POA: Diagnosis not present

## 2023-04-14 DIAGNOSIS — C44719 Basal cell carcinoma of skin of left lower limb, including hip: Secondary | ICD-10-CM | POA: Diagnosis not present

## 2023-05-09 DIAGNOSIS — I252 Old myocardial infarction: Secondary | ICD-10-CM | POA: Diagnosis not present

## 2023-05-09 DIAGNOSIS — Z951 Presence of aortocoronary bypass graft: Secondary | ICD-10-CM | POA: Diagnosis not present

## 2023-05-09 DIAGNOSIS — E782 Mixed hyperlipidemia: Secondary | ICD-10-CM | POA: Diagnosis not present

## 2023-05-09 DIAGNOSIS — I517 Cardiomegaly: Secondary | ICD-10-CM | POA: Diagnosis not present

## 2023-05-09 DIAGNOSIS — Z9889 Other specified postprocedural states: Secondary | ICD-10-CM | POA: Diagnosis not present

## 2023-05-09 DIAGNOSIS — I4510 Unspecified right bundle-branch block: Secondary | ICD-10-CM | POA: Diagnosis not present

## 2023-05-09 DIAGNOSIS — E1165 Type 2 diabetes mellitus with hyperglycemia: Secondary | ICD-10-CM | POA: Diagnosis not present

## 2023-05-09 DIAGNOSIS — I2510 Atherosclerotic heart disease of native coronary artery without angina pectoris: Secondary | ICD-10-CM | POA: Diagnosis not present

## 2023-05-09 DIAGNOSIS — I495 Sick sinus syndrome: Secondary | ICD-10-CM | POA: Diagnosis not present

## 2023-05-09 DIAGNOSIS — I10 Essential (primary) hypertension: Secondary | ICD-10-CM | POA: Diagnosis not present

## 2023-05-24 DIAGNOSIS — Z978 Presence of other specified devices: Secondary | ICD-10-CM | POA: Diagnosis not present

## 2023-05-24 DIAGNOSIS — E1165 Type 2 diabetes mellitus with hyperglycemia: Secondary | ICD-10-CM | POA: Diagnosis not present

## 2023-05-24 DIAGNOSIS — Z7984 Long term (current) use of oral hypoglycemic drugs: Secondary | ICD-10-CM | POA: Diagnosis not present

## 2023-05-24 DIAGNOSIS — Z794 Long term (current) use of insulin: Secondary | ICD-10-CM | POA: Diagnosis not present

## 2023-06-02 DIAGNOSIS — Z6831 Body mass index (BMI) 31.0-31.9, adult: Secondary | ICD-10-CM | POA: Diagnosis not present

## 2023-06-02 DIAGNOSIS — E1165 Type 2 diabetes mellitus with hyperglycemia: Secondary | ICD-10-CM | POA: Diagnosis not present

## 2023-06-02 DIAGNOSIS — E785 Hyperlipidemia, unspecified: Secondary | ICD-10-CM | POA: Diagnosis not present

## 2023-06-02 DIAGNOSIS — E119 Type 2 diabetes mellitus without complications: Secondary | ICD-10-CM | POA: Diagnosis not present

## 2023-06-02 DIAGNOSIS — T8149XA Infection following a procedure, other surgical site, initial encounter: Secondary | ICD-10-CM | POA: Diagnosis not present

## 2023-06-02 DIAGNOSIS — E1169 Type 2 diabetes mellitus with other specified complication: Secondary | ICD-10-CM | POA: Diagnosis not present

## 2023-06-02 DIAGNOSIS — T8130XA Disruption of wound, unspecified, initial encounter: Secondary | ICD-10-CM | POA: Diagnosis not present

## 2023-06-02 DIAGNOSIS — B354 Tinea corporis: Secondary | ICD-10-CM | POA: Diagnosis not present

## 2023-06-09 DIAGNOSIS — I495 Sick sinus syndrome: Secondary | ICD-10-CM | POA: Diagnosis not present

## 2023-06-13 DIAGNOSIS — T8189XA Other complications of procedures, not elsewhere classified, initial encounter: Secondary | ICD-10-CM | POA: Diagnosis not present

## 2023-06-13 DIAGNOSIS — Y838 Other surgical procedures as the cause of abnormal reaction of the patient, or of later complication, without mention of misadventure at the time of the procedure: Secondary | ICD-10-CM | POA: Diagnosis not present

## 2023-06-13 DIAGNOSIS — I495 Sick sinus syndrome: Secondary | ICD-10-CM | POA: Diagnosis not present

## 2023-06-13 DIAGNOSIS — Z45010 Encounter for checking and testing of cardiac pacemaker pulse generator [battery]: Secondary | ICD-10-CM | POA: Diagnosis not present

## 2023-06-20 DIAGNOSIS — M8580 Other specified disorders of bone density and structure, unspecified site: Secondary | ICD-10-CM | POA: Diagnosis not present

## 2023-06-20 DIAGNOSIS — T8189XA Other complications of procedures, not elsewhere classified, initial encounter: Secondary | ICD-10-CM | POA: Diagnosis not present

## 2023-06-20 DIAGNOSIS — E785 Hyperlipidemia, unspecified: Secondary | ICD-10-CM | POA: Diagnosis not present

## 2023-06-20 DIAGNOSIS — Y838 Other surgical procedures as the cause of abnormal reaction of the patient, or of later complication, without mention of misadventure at the time of the procedure: Secondary | ICD-10-CM | POA: Diagnosis not present

## 2023-06-20 DIAGNOSIS — E1169 Type 2 diabetes mellitus with other specified complication: Secondary | ICD-10-CM | POA: Diagnosis not present

## 2023-06-20 DIAGNOSIS — D638 Anemia in other chronic diseases classified elsewhere: Secondary | ICD-10-CM | POA: Diagnosis not present

## 2023-07-04 DIAGNOSIS — I10 Essential (primary) hypertension: Secondary | ICD-10-CM | POA: Diagnosis not present

## 2023-07-04 DIAGNOSIS — I25119 Atherosclerotic heart disease of native coronary artery with unspecified angina pectoris: Secondary | ICD-10-CM | POA: Diagnosis not present

## 2023-07-04 DIAGNOSIS — E1169 Type 2 diabetes mellitus with other specified complication: Secondary | ICD-10-CM | POA: Diagnosis not present

## 2023-07-04 DIAGNOSIS — M8589 Other specified disorders of bone density and structure, multiple sites: Secondary | ICD-10-CM | POA: Diagnosis not present

## 2023-07-04 DIAGNOSIS — D6869 Other thrombophilia: Secondary | ICD-10-CM | POA: Diagnosis not present

## 2023-07-04 DIAGNOSIS — I672 Cerebral atherosclerosis: Secondary | ICD-10-CM | POA: Diagnosis not present

## 2023-07-04 DIAGNOSIS — Y838 Other surgical procedures as the cause of abnormal reaction of the patient, or of later complication, without mention of misadventure at the time of the procedure: Secondary | ICD-10-CM | POA: Diagnosis not present

## 2023-07-04 DIAGNOSIS — E785 Hyperlipidemia, unspecified: Secondary | ICD-10-CM | POA: Diagnosis not present

## 2023-07-04 DIAGNOSIS — I4891 Unspecified atrial fibrillation: Secondary | ICD-10-CM | POA: Diagnosis not present

## 2023-07-04 DIAGNOSIS — M8580 Other specified disorders of bone density and structure, unspecified site: Secondary | ICD-10-CM | POA: Diagnosis not present

## 2023-07-04 DIAGNOSIS — T8189XA Other complications of procedures, not elsewhere classified, initial encounter: Secondary | ICD-10-CM | POA: Diagnosis not present

## 2023-07-04 DIAGNOSIS — T8130XS Disruption of wound, unspecified, sequela: Secondary | ICD-10-CM | POA: Diagnosis not present

## 2023-07-12 DIAGNOSIS — Y838 Other surgical procedures as the cause of abnormal reaction of the patient, or of later complication, without mention of misadventure at the time of the procedure: Secondary | ICD-10-CM | POA: Diagnosis not present

## 2023-07-12 DIAGNOSIS — T8189XA Other complications of procedures, not elsewhere classified, initial encounter: Secondary | ICD-10-CM | POA: Diagnosis not present

## 2023-07-25 DIAGNOSIS — T8189XA Other complications of procedures, not elsewhere classified, initial encounter: Secondary | ICD-10-CM | POA: Diagnosis not present

## 2023-07-25 DIAGNOSIS — Y838 Other surgical procedures as the cause of abnormal reaction of the patient, or of later complication, without mention of misadventure at the time of the procedure: Secondary | ICD-10-CM | POA: Diagnosis not present

## 2023-08-08 DIAGNOSIS — T8189XA Other complications of procedures, not elsewhere classified, initial encounter: Secondary | ICD-10-CM | POA: Diagnosis not present

## 2023-08-08 DIAGNOSIS — Y838 Other surgical procedures as the cause of abnormal reaction of the patient, or of later complication, without mention of misadventure at the time of the procedure: Secondary | ICD-10-CM | POA: Diagnosis not present

## 2023-08-25 DIAGNOSIS — H6123 Impacted cerumen, bilateral: Secondary | ICD-10-CM | POA: Diagnosis not present

## 2023-08-25 DIAGNOSIS — Z6831 Body mass index (BMI) 31.0-31.9, adult: Secondary | ICD-10-CM | POA: Diagnosis not present

## 2023-08-29 DIAGNOSIS — Y838 Other surgical procedures as the cause of abnormal reaction of the patient, or of later complication, without mention of misadventure at the time of the procedure: Secondary | ICD-10-CM | POA: Diagnosis not present

## 2023-08-29 DIAGNOSIS — T8189XA Other complications of procedures, not elsewhere classified, initial encounter: Secondary | ICD-10-CM | POA: Diagnosis not present

## 2023-08-30 DIAGNOSIS — C44719 Basal cell carcinoma of skin of left lower limb, including hip: Secondary | ICD-10-CM | POA: Diagnosis not present

## 2023-08-31 DIAGNOSIS — E1165 Type 2 diabetes mellitus with hyperglycemia: Secondary | ICD-10-CM | POA: Diagnosis not present

## 2023-08-31 DIAGNOSIS — E119 Type 2 diabetes mellitus without complications: Secondary | ICD-10-CM | POA: Diagnosis not present

## 2023-09-08 DIAGNOSIS — I495 Sick sinus syndrome: Secondary | ICD-10-CM | POA: Diagnosis not present

## 2023-09-12 DIAGNOSIS — I495 Sick sinus syndrome: Secondary | ICD-10-CM | POA: Diagnosis not present

## 2023-09-12 DIAGNOSIS — Z45010 Encounter for checking and testing of cardiac pacemaker pulse generator [battery]: Secondary | ICD-10-CM | POA: Diagnosis not present

## 2023-09-27 DIAGNOSIS — Z794 Long term (current) use of insulin: Secondary | ICD-10-CM | POA: Diagnosis not present

## 2023-09-27 DIAGNOSIS — E1165 Type 2 diabetes mellitus with hyperglycemia: Secondary | ICD-10-CM | POA: Diagnosis not present

## 2023-09-27 DIAGNOSIS — Z7984 Long term (current) use of oral hypoglycemic drugs: Secondary | ICD-10-CM | POA: Diagnosis not present

## 2023-09-27 DIAGNOSIS — Z7985 Long-term (current) use of injectable non-insulin antidiabetic drugs: Secondary | ICD-10-CM | POA: Diagnosis not present

## 2023-11-14 DIAGNOSIS — I252 Old myocardial infarction: Secondary | ICD-10-CM | POA: Diagnosis not present

## 2023-11-14 DIAGNOSIS — R001 Bradycardia, unspecified: Secondary | ICD-10-CM | POA: Diagnosis not present

## 2023-11-14 DIAGNOSIS — I452 Bifascicular block: Secondary | ICD-10-CM | POA: Diagnosis not present

## 2023-11-14 DIAGNOSIS — I97190 Other postprocedural cardiac functional disturbances following cardiac surgery: Secondary | ICD-10-CM | POA: Diagnosis not present

## 2023-11-14 DIAGNOSIS — Z951 Presence of aortocoronary bypass graft: Secondary | ICD-10-CM | POA: Diagnosis not present

## 2023-11-14 DIAGNOSIS — I498 Other specified cardiac arrhythmias: Secondary | ICD-10-CM | POA: Diagnosis not present

## 2023-11-14 DIAGNOSIS — I10 Essential (primary) hypertension: Secondary | ICD-10-CM | POA: Diagnosis not present

## 2023-11-14 DIAGNOSIS — Z950 Presence of cardiac pacemaker: Secondary | ICD-10-CM | POA: Diagnosis not present

## 2023-11-14 DIAGNOSIS — I255 Ischemic cardiomyopathy: Secondary | ICD-10-CM | POA: Diagnosis not present

## 2023-11-14 DIAGNOSIS — I2510 Atherosclerotic heart disease of native coronary artery without angina pectoris: Secondary | ICD-10-CM | POA: Diagnosis not present

## 2023-11-29 DIAGNOSIS — E1165 Type 2 diabetes mellitus with hyperglycemia: Secondary | ICD-10-CM | POA: Diagnosis not present

## 2023-11-29 DIAGNOSIS — E119 Type 2 diabetes mellitus without complications: Secondary | ICD-10-CM | POA: Diagnosis not present

## 2023-12-01 DIAGNOSIS — J069 Acute upper respiratory infection, unspecified: Secondary | ICD-10-CM | POA: Diagnosis not present

## 2023-12-09 DIAGNOSIS — I5030 Unspecified diastolic (congestive) heart failure: Secondary | ICD-10-CM | POA: Diagnosis not present

## 2023-12-12 DIAGNOSIS — Z45010 Encounter for checking and testing of cardiac pacemaker pulse generator [battery]: Secondary | ICD-10-CM | POA: Diagnosis not present

## 2023-12-12 DIAGNOSIS — I495 Sick sinus syndrome: Secondary | ICD-10-CM | POA: Diagnosis not present

## 2023-12-27 DIAGNOSIS — M8580 Other specified disorders of bone density and structure, unspecified site: Secondary | ICD-10-CM | POA: Diagnosis not present

## 2023-12-27 DIAGNOSIS — E1169 Type 2 diabetes mellitus with other specified complication: Secondary | ICD-10-CM | POA: Diagnosis not present

## 2023-12-27 DIAGNOSIS — D638 Anemia in other chronic diseases classified elsewhere: Secondary | ICD-10-CM | POA: Diagnosis not present

## 2023-12-27 DIAGNOSIS — E785 Hyperlipidemia, unspecified: Secondary | ICD-10-CM | POA: Diagnosis not present

## 2023-12-27 DIAGNOSIS — Z125 Encounter for screening for malignant neoplasm of prostate: Secondary | ICD-10-CM | POA: Diagnosis not present

## 2024-01-05 DIAGNOSIS — Z950 Presence of cardiac pacemaker: Secondary | ICD-10-CM | POA: Diagnosis not present

## 2024-01-05 DIAGNOSIS — D6869 Other thrombophilia: Secondary | ICD-10-CM | POA: Diagnosis not present

## 2024-01-05 DIAGNOSIS — E785 Hyperlipidemia, unspecified: Secondary | ICD-10-CM | POA: Diagnosis not present

## 2024-01-05 DIAGNOSIS — Z0000 Encounter for general adult medical examination without abnormal findings: Secondary | ICD-10-CM | POA: Diagnosis not present

## 2024-01-05 DIAGNOSIS — E1169 Type 2 diabetes mellitus with other specified complication: Secondary | ICD-10-CM | POA: Diagnosis not present

## 2024-01-05 DIAGNOSIS — H6123 Impacted cerumen, bilateral: Secondary | ICD-10-CM | POA: Diagnosis not present

## 2024-01-05 DIAGNOSIS — I6523 Occlusion and stenosis of bilateral carotid arteries: Secondary | ICD-10-CM | POA: Diagnosis not present

## 2024-01-05 DIAGNOSIS — I672 Cerebral atherosclerosis: Secondary | ICD-10-CM | POA: Diagnosis not present

## 2024-01-05 DIAGNOSIS — I25119 Atherosclerotic heart disease of native coronary artery with unspecified angina pectoris: Secondary | ICD-10-CM | POA: Diagnosis not present

## 2024-01-31 DIAGNOSIS — Z794 Long term (current) use of insulin: Secondary | ICD-10-CM | POA: Diagnosis not present

## 2024-01-31 DIAGNOSIS — Z7985 Long-term (current) use of injectable non-insulin antidiabetic drugs: Secondary | ICD-10-CM | POA: Diagnosis not present

## 2024-01-31 DIAGNOSIS — Z978 Presence of other specified devices: Secondary | ICD-10-CM | POA: Diagnosis not present

## 2024-01-31 DIAGNOSIS — E1165 Type 2 diabetes mellitus with hyperglycemia: Secondary | ICD-10-CM | POA: Diagnosis not present

## 2024-02-27 DIAGNOSIS — E1165 Type 2 diabetes mellitus with hyperglycemia: Secondary | ICD-10-CM | POA: Diagnosis not present

## 2024-02-27 DIAGNOSIS — E119 Type 2 diabetes mellitus without complications: Secondary | ICD-10-CM | POA: Diagnosis not present

## 2024-03-01 DIAGNOSIS — Z45018 Encounter for adjustment and management of other part of cardiac pacemaker: Secondary | ICD-10-CM | POA: Diagnosis not present

## 2024-03-01 DIAGNOSIS — I495 Sick sinus syndrome: Secondary | ICD-10-CM | POA: Diagnosis not present

## 2024-03-01 DIAGNOSIS — Z9889 Other specified postprocedural states: Secondary | ICD-10-CM | POA: Diagnosis not present

## 2024-03-01 DIAGNOSIS — I252 Old myocardial infarction: Secondary | ICD-10-CM | POA: Diagnosis not present

## 2024-03-01 DIAGNOSIS — I2510 Atherosclerotic heart disease of native coronary artery without angina pectoris: Secondary | ICD-10-CM | POA: Diagnosis not present

## 2024-03-01 DIAGNOSIS — I4719 Other supraventricular tachycardia: Secondary | ICD-10-CM | POA: Diagnosis not present

## 2024-03-01 DIAGNOSIS — Z951 Presence of aortocoronary bypass graft: Secondary | ICD-10-CM | POA: Diagnosis not present

## 2024-03-12 DIAGNOSIS — Z9889 Other specified postprocedural states: Secondary | ICD-10-CM | POA: Diagnosis not present

## 2024-03-12 DIAGNOSIS — I25119 Atherosclerotic heart disease of native coronary artery with unspecified angina pectoris: Secondary | ICD-10-CM | POA: Diagnosis not present

## 2024-03-12 DIAGNOSIS — Z8679 Personal history of other diseases of the circulatory system: Secondary | ICD-10-CM | POA: Diagnosis not present

## 2024-03-12 DIAGNOSIS — I4510 Unspecified right bundle-branch block: Secondary | ICD-10-CM | POA: Diagnosis not present

## 2024-03-12 DIAGNOSIS — I495 Sick sinus syndrome: Secondary | ICD-10-CM | POA: Diagnosis not present

## 2024-03-12 DIAGNOSIS — Z45018 Encounter for adjustment and management of other part of cardiac pacemaker: Secondary | ICD-10-CM | POA: Diagnosis not present

## 2024-03-12 DIAGNOSIS — Z951 Presence of aortocoronary bypass graft: Secondary | ICD-10-CM | POA: Diagnosis not present

## 2024-03-12 DIAGNOSIS — I252 Old myocardial infarction: Secondary | ICD-10-CM | POA: Diagnosis not present

## 2024-03-12 DIAGNOSIS — I10 Essential (primary) hypertension: Secondary | ICD-10-CM | POA: Diagnosis not present

## 2024-04-10 DIAGNOSIS — L814 Other melanin hyperpigmentation: Secondary | ICD-10-CM | POA: Diagnosis not present

## 2024-04-10 DIAGNOSIS — L570 Actinic keratosis: Secondary | ICD-10-CM | POA: Diagnosis not present

## 2024-04-10 DIAGNOSIS — L821 Other seborrheic keratosis: Secondary | ICD-10-CM | POA: Diagnosis not present

## 2024-04-10 DIAGNOSIS — D225 Melanocytic nevi of trunk: Secondary | ICD-10-CM | POA: Diagnosis not present

## 2024-04-10 DIAGNOSIS — C44619 Basal cell carcinoma of skin of left upper limb, including shoulder: Secondary | ICD-10-CM | POA: Diagnosis not present

## 2024-04-10 DIAGNOSIS — L578 Other skin changes due to chronic exposure to nonionizing radiation: Secondary | ICD-10-CM | POA: Diagnosis not present

## 2024-04-12 DIAGNOSIS — M1712 Unilateral primary osteoarthritis, left knee: Secondary | ICD-10-CM | POA: Diagnosis not present

## 2024-04-24 DIAGNOSIS — E119 Type 2 diabetes mellitus without complications: Secondary | ICD-10-CM | POA: Diagnosis not present

## 2024-04-24 DIAGNOSIS — Z9842 Cataract extraction status, left eye: Secondary | ICD-10-CM | POA: Diagnosis not present

## 2024-04-24 DIAGNOSIS — Z961 Presence of intraocular lens: Secondary | ICD-10-CM | POA: Diagnosis not present

## 2024-04-24 DIAGNOSIS — H353111 Nonexudative age-related macular degeneration, right eye, early dry stage: Secondary | ICD-10-CM | POA: Diagnosis not present

## 2024-04-24 DIAGNOSIS — H5231 Anisometropia: Secondary | ICD-10-CM | POA: Diagnosis not present

## 2024-04-24 DIAGNOSIS — Z7984 Long term (current) use of oral hypoglycemic drugs: Secondary | ICD-10-CM | POA: Diagnosis not present

## 2024-04-24 DIAGNOSIS — Z9841 Cataract extraction status, right eye: Secondary | ICD-10-CM | POA: Diagnosis not present

## 2024-04-24 DIAGNOSIS — H52223 Regular astigmatism, bilateral: Secondary | ICD-10-CM | POA: Diagnosis not present

## 2024-04-24 DIAGNOSIS — Z794 Long term (current) use of insulin: Secondary | ICD-10-CM | POA: Diagnosis not present

## 2024-04-25 DIAGNOSIS — M25562 Pain in left knee: Secondary | ICD-10-CM | POA: Diagnosis not present

## 2024-04-25 DIAGNOSIS — Z950 Presence of cardiac pacemaker: Secondary | ICD-10-CM | POA: Diagnosis not present

## 2024-04-26 DIAGNOSIS — S8002XA Contusion of left knee, initial encounter: Secondary | ICD-10-CM | POA: Diagnosis not present

## 2024-04-26 DIAGNOSIS — S83412A Sprain of medial collateral ligament of left knee, initial encounter: Secondary | ICD-10-CM | POA: Diagnosis not present

## 2024-05-02 DIAGNOSIS — M84453A Pathological fracture, unspecified femur, initial encounter for fracture: Secondary | ICD-10-CM | POA: Diagnosis not present

## 2024-05-02 DIAGNOSIS — M1712 Unilateral primary osteoarthritis, left knee: Secondary | ICD-10-CM | POA: Diagnosis not present

## 2024-05-08 DIAGNOSIS — C44519 Basal cell carcinoma of skin of other part of trunk: Secondary | ICD-10-CM | POA: Diagnosis not present

## 2024-05-15 DIAGNOSIS — M1712 Unilateral primary osteoarthritis, left knee: Secondary | ICD-10-CM | POA: Diagnosis not present

## 2024-05-27 DIAGNOSIS — E119 Type 2 diabetes mellitus without complications: Secondary | ICD-10-CM | POA: Diagnosis not present

## 2024-05-27 DIAGNOSIS — E1165 Type 2 diabetes mellitus with hyperglycemia: Secondary | ICD-10-CM | POA: Diagnosis not present

## 2024-05-28 DIAGNOSIS — I10 Essential (primary) hypertension: Secondary | ICD-10-CM | POA: Diagnosis not present

## 2024-05-28 DIAGNOSIS — I495 Sick sinus syndrome: Secondary | ICD-10-CM | POA: Diagnosis not present

## 2024-05-28 DIAGNOSIS — Z45018 Encounter for adjustment and management of other part of cardiac pacemaker: Secondary | ICD-10-CM | POA: Diagnosis not present

## 2024-05-28 DIAGNOSIS — I2510 Atherosclerotic heart disease of native coronary artery without angina pectoris: Secondary | ICD-10-CM | POA: Diagnosis not present

## 2024-05-28 DIAGNOSIS — Z9889 Other specified postprocedural states: Secondary | ICD-10-CM | POA: Diagnosis not present

## 2024-05-28 DIAGNOSIS — I4510 Unspecified right bundle-branch block: Secondary | ICD-10-CM | POA: Diagnosis not present

## 2024-05-28 DIAGNOSIS — I252 Old myocardial infarction: Secondary | ICD-10-CM | POA: Diagnosis not present

## 2024-05-28 DIAGNOSIS — Z8679 Personal history of other diseases of the circulatory system: Secondary | ICD-10-CM | POA: Diagnosis not present

## 2024-05-28 DIAGNOSIS — Z951 Presence of aortocoronary bypass graft: Secondary | ICD-10-CM | POA: Diagnosis not present

## 2024-05-29 DIAGNOSIS — E1165 Type 2 diabetes mellitus with hyperglycemia: Secondary | ICD-10-CM | POA: Diagnosis not present

## 2024-05-29 DIAGNOSIS — Z794 Long term (current) use of insulin: Secondary | ICD-10-CM | POA: Diagnosis not present

## 2024-06-11 DIAGNOSIS — Z950 Presence of cardiac pacemaker: Secondary | ICD-10-CM | POA: Diagnosis not present

## 2024-07-02 DIAGNOSIS — M8580 Other specified disorders of bone density and structure, unspecified site: Secondary | ICD-10-CM | POA: Diagnosis not present

## 2024-07-02 DIAGNOSIS — E785 Hyperlipidemia, unspecified: Secondary | ICD-10-CM | POA: Diagnosis not present

## 2024-07-02 DIAGNOSIS — D638 Anemia in other chronic diseases classified elsewhere: Secondary | ICD-10-CM | POA: Diagnosis not present

## 2024-07-02 DIAGNOSIS — E1169 Type 2 diabetes mellitus with other specified complication: Secondary | ICD-10-CM | POA: Diagnosis not present

## 2024-07-19 DIAGNOSIS — Z950 Presence of cardiac pacemaker: Secondary | ICD-10-CM | POA: Diagnosis not present

## 2024-07-19 DIAGNOSIS — I25119 Atherosclerotic heart disease of native coronary artery with unspecified angina pectoris: Secondary | ICD-10-CM | POA: Diagnosis not present

## 2024-07-19 DIAGNOSIS — K2960 Other gastritis without bleeding: Secondary | ICD-10-CM | POA: Diagnosis not present

## 2024-07-19 DIAGNOSIS — I4891 Unspecified atrial fibrillation: Secondary | ICD-10-CM | POA: Diagnosis not present

## 2024-07-19 DIAGNOSIS — E1169 Type 2 diabetes mellitus with other specified complication: Secondary | ICD-10-CM | POA: Diagnosis not present

## 2024-07-19 DIAGNOSIS — I10 Essential (primary) hypertension: Secondary | ICD-10-CM | POA: Diagnosis not present

## 2024-07-19 DIAGNOSIS — E785 Hyperlipidemia, unspecified: Secondary | ICD-10-CM | POA: Diagnosis not present

## 2024-07-19 DIAGNOSIS — D6869 Other thrombophilia: Secondary | ICD-10-CM | POA: Diagnosis not present

## 2024-07-19 DIAGNOSIS — I672 Cerebral atherosclerosis: Secondary | ICD-10-CM | POA: Diagnosis not present

## 2024-07-19 DIAGNOSIS — M1712 Unilateral primary osteoarthritis, left knee: Secondary | ICD-10-CM | POA: Diagnosis not present

## 2024-08-13 ENCOUNTER — Telehealth: Payer: Self-pay

## 2024-08-13 NOTE — Transitions of Care (Post Inpatient/ED Visit) (Signed)
" ° °  08/13/2024  Name: Cody Mccann MRN: [id number] DOB: [birth year]  Today's TOC FU Call Status: Today's TOC FU Call Status:: Successful TOC FU Call Completed TOC FU Call Complete Date: 08/13/24  Patient's Name and Date of Birth confirmed. Name, DOB  Transition Care Management Follow-up Telephone Call Date of Discharge: 08/10/24 Discharge Facility: Other ([facility]) Name of Other ([facility]) [facility] Facility: [facility] Type of Discharge: Inpatient Admission Primary Inpatient Discharge Diagnosis:: Fall,  Fracture to the right distal radius How have you been since you were released from the hospital?: Better Any questions or concerns?: No  Items Reviewed: Did you receive and understand the discharge instructions provided?: Yes Medications obtained,verified, and reconciled?: Yes (Medications Reviewed) (verbally states that he has all his medications)  Medications Reviewed Today: verbally states that he has all his medications- declines review Medications Reviewed Today   Medications were not reviewed in this encounter     Placed call to patient and explained reason for call. Patient reports that he is doing well. Reports that he is not able to complete call today.  I offered to call back another day and patient declines additional call. Encouraged patient to see PCP for follow up.  Alan Ee, RN, BSN, CEN Population Health- Transition of Care Team.  [facility] [phone]  "
